# Patient Record
Sex: Female | Born: 1945
Health system: Southern US, Community
[De-identification: ages and names within clinical notes are randomized; demographics above are authoritative.]

## PROBLEM LIST (undated history)

## (undated) DIAGNOSIS — K649 Unspecified hemorrhoids: Secondary | ICD-10-CM

## (undated) DIAGNOSIS — E079 Disorder of thyroid, unspecified: Secondary | ICD-10-CM

## (undated) DIAGNOSIS — R195 Other fecal abnormalities: Secondary | ICD-10-CM

## (undated) DIAGNOSIS — E039 Hypothyroidism, unspecified: Secondary | ICD-10-CM

## (undated) DIAGNOSIS — N189 Chronic kidney disease, unspecified: Secondary | ICD-10-CM

## (undated) HISTORY — DX: Disorder of thyroid, unspecified: E07.9

## (undated) HISTORY — DX: Other fecal abnormalities: R19.5

## (undated) HISTORY — DX: Hypothyroidism, unspecified: E03.9

## (undated) HISTORY — DX: Chronic kidney disease, unspecified: N18.9

## (undated) HISTORY — PX: ABDOMINAL HYSTERECTOMY: SHX81

## (undated) HISTORY — DX: Unspecified hemorrhoids: K64.9

---

## 2000-10-05 ENCOUNTER — Encounter: Payer: Self-pay | Admitting: Specialist

## 2000-10-05 ENCOUNTER — Ambulatory Visit (HOSPITAL_COMMUNITY): Admission: RE | Admit: 2000-10-05 | Discharge: 2000-10-05 | Payer: Self-pay | Admitting: Specialist

## 2000-10-27 ENCOUNTER — Other Ambulatory Visit: Admission: RE | Admit: 2000-10-27 | Discharge: 2000-10-27 | Payer: Self-pay | Admitting: Specialist

## 2001-09-20 ENCOUNTER — Ambulatory Visit (HOSPITAL_COMMUNITY): Admission: RE | Admit: 2001-09-20 | Discharge: 2001-09-20 | Payer: Self-pay | Admitting: Specialist

## 2001-09-20 ENCOUNTER — Encounter: Payer: Self-pay | Admitting: Specialist

## 2002-10-31 ENCOUNTER — Ambulatory Visit (HOSPITAL_COMMUNITY): Admission: RE | Admit: 2002-10-31 | Discharge: 2002-10-31 | Payer: Self-pay | Admitting: Obstetrics and Gynecology

## 2002-10-31 ENCOUNTER — Encounter: Payer: Self-pay | Admitting: Obstetrics and Gynecology

## 2003-11-01 ENCOUNTER — Ambulatory Visit (HOSPITAL_COMMUNITY): Admission: RE | Admit: 2003-11-01 | Discharge: 2003-11-01 | Payer: Self-pay | Admitting: Obstetrics and Gynecology

## 2004-11-03 ENCOUNTER — Ambulatory Visit (HOSPITAL_COMMUNITY): Admission: RE | Admit: 2004-11-03 | Discharge: 2004-11-03 | Payer: Self-pay | Admitting: Obstetrics and Gynecology

## 2005-11-04 ENCOUNTER — Ambulatory Visit (HOSPITAL_COMMUNITY): Admission: RE | Admit: 2005-11-04 | Discharge: 2005-11-04 | Payer: Self-pay | Admitting: Obstetrics and Gynecology

## 2005-12-28 ENCOUNTER — Ambulatory Visit (HOSPITAL_COMMUNITY): Admission: RE | Admit: 2005-12-28 | Discharge: 2005-12-28 | Payer: Self-pay | Admitting: Internal Medicine

## 2005-12-28 ENCOUNTER — Ambulatory Visit: Payer: Self-pay | Admitting: Internal Medicine

## 2006-10-21 ENCOUNTER — Ambulatory Visit (HOSPITAL_COMMUNITY): Admission: RE | Admit: 2006-10-21 | Discharge: 2006-10-21 | Payer: Self-pay | Admitting: Obstetrics and Gynecology

## 2006-11-15 ENCOUNTER — Other Ambulatory Visit: Admission: RE | Admit: 2006-11-15 | Discharge: 2006-11-15 | Payer: Self-pay | Admitting: Obstetrics and Gynecology

## 2007-08-31 ENCOUNTER — Ambulatory Visit (HOSPITAL_COMMUNITY): Admission: RE | Admit: 2007-08-31 | Discharge: 2007-08-31 | Payer: Self-pay | Admitting: Obstetrics and Gynecology

## 2007-09-12 ENCOUNTER — Ambulatory Visit (HOSPITAL_COMMUNITY): Admission: RE | Admit: 2007-09-12 | Discharge: 2007-09-12 | Payer: Self-pay | Admitting: Obstetrics and Gynecology

## 2007-11-07 ENCOUNTER — Other Ambulatory Visit: Admission: RE | Admit: 2007-11-07 | Discharge: 2007-11-07 | Payer: Self-pay | Admitting: Obstetrics and Gynecology

## 2008-09-13 ENCOUNTER — Ambulatory Visit (HOSPITAL_COMMUNITY): Admission: RE | Admit: 2008-09-13 | Discharge: 2008-09-13 | Payer: Self-pay | Admitting: Obstetrics and Gynecology

## 2008-11-07 ENCOUNTER — Other Ambulatory Visit: Admission: RE | Admit: 2008-11-07 | Discharge: 2008-11-07 | Payer: Self-pay | Admitting: Obstetrics and Gynecology

## 2009-05-27 ENCOUNTER — Ambulatory Visit (HOSPITAL_COMMUNITY)
Admission: RE | Admit: 2009-05-27 | Discharge: 2009-05-27 | Payer: Self-pay | Admitting: Physical Medicine and Rehabilitation

## 2009-09-27 ENCOUNTER — Ambulatory Visit (HOSPITAL_COMMUNITY): Admission: RE | Admit: 2009-09-27 | Discharge: 2009-09-27 | Payer: Self-pay | Admitting: Obstetrics and Gynecology

## 2010-07-25 NOTE — Op Note (Signed)
NAME:  Brooke Leach, Brooke Leach              ACCOUNT NO.:  [id number]   MEDICAL RECORD NO.:  [id number]          PATIENT TYPE:  AMB   LOCATION:  DAY                           FACILITY:  [facility]   PHYSICIAN:  R. Roetta Sessions, M.D. DATE OF BIRTH:  [birth year]   DATE OF PROCEDURE:  12/28/2005  DATE OF DISCHARGE:                                 OPERATIVE REPORT   INDICATIONS FOR PROCEDURE:  The patient is a 65 year old lady sent over  courtesy of Dr. Christin Bach for colorectal cancer screening.  She is  followed primarily by Dr. Carylon Perches as well.  She has no lower GI tract  symptoms.  She has never had her colon imaged.  There is no family history  of colorectal neoplasia.  Colonoscopy is now being done as a screening  maneuver.  This approach has been discussed with the patient at length.  Potential risks, benefits, and alternatives  have been reviewed, and  questions answered.  She is agreeable.  Please see documentation in the  medical record.   PROCEDURE NOTE:  O2 saturation, blood pressure, pulse, and respirations were  monitored throughout the entire procedure.   CONSCIOUS SEDATION:  Versed 4 mg IV, Demerol 75 mg IV in divided doses.   INSTRUMENT:  Olympus video chip system.   FINDINGS:  Digital rectal exam revealed no abnormalities.  Endoscopic  findings showed the prep was good.  Rectum:  Examination rectal mucosa and  retroflexed view of the anal verge revealed no abnormalities.  Colon:  Colonic mucosa was surveyed from the rectosigmoid junction through the left,  transverse, right colon, the appendiceal orifice, ileocecal valve, and  cecum.  These structures well seen photographed for the record.  From this  level, the scope slowly and cautiously withdrawn.  All previously mentioned  mucosal surfaces were again seen.  Colonic mucosa appeared normal.  The  patient tolerated the procedure well and was reactivated.   IMPRESSION:  1. Normal rectum.  2. Normal colon.   RECOMMENDATIONS:  Repeat screening colonoscopy in 10 years.Jonathon Bellows, M.D.  Electronically Signed     RMR/MEDQ  D:  12/28/2005  T:  12/28/2005  Job:  [id number]   cc:   Tilda Burrow, M.D.  Fax: [phone]   Kingsley Callander. Ouida Sills, MD  Fax: [phone]

## 2010-08-26 ENCOUNTER — Other Ambulatory Visit: Payer: Self-pay | Admitting: Obstetrics and Gynecology

## 2010-08-26 DIAGNOSIS — Z139 Encounter for screening, unspecified: Secondary | ICD-10-CM

## 2010-09-30 ENCOUNTER — Ambulatory Visit (HOSPITAL_COMMUNITY)
Admission: RE | Admit: 2010-09-30 | Discharge: 2010-09-30 | Disposition: A | Discharge status: Home / Self Care | Payer: Medicare Other | Source: Ambulatory Visit | Attending: Obstetrics and Gynecology | Admitting: Obstetrics and Gynecology

## 2010-09-30 DIAGNOSIS — Z139 Encounter for screening, unspecified: Secondary | ICD-10-CM

## 2010-09-30 DIAGNOSIS — Z1231 Encounter for screening mammogram for malignant neoplasm of breast: Secondary | ICD-10-CM | POA: Insufficient documentation

## 2010-10-06 ENCOUNTER — Other Ambulatory Visit: Payer: Self-pay | Admitting: Obstetrics and Gynecology

## 2010-10-06 DIAGNOSIS — R928 Other abnormal and inconclusive findings on diagnostic imaging of breast: Secondary | ICD-10-CM

## 2010-10-15 ENCOUNTER — Ambulatory Visit (HOSPITAL_COMMUNITY)
Admission: RE | Admit: 2010-10-15 | Discharge: 2010-10-15 | Disposition: A | Discharge status: Home / Self Care | Payer: Medicare Other | Source: Ambulatory Visit | Attending: Obstetrics and Gynecology | Admitting: Obstetrics and Gynecology

## 2010-10-15 DIAGNOSIS — R928 Other abnormal and inconclusive findings on diagnostic imaging of breast: Secondary | ICD-10-CM | POA: Insufficient documentation

## 2010-10-22 ENCOUNTER — Inpatient Hospital Stay (HOSPITAL_COMMUNITY): Admission: RE | Admit: 2010-10-22 | Payer: Medicare Other | Source: Ambulatory Visit

## 2011-09-25 ENCOUNTER — Other Ambulatory Visit: Payer: Self-pay | Admitting: Obstetrics and Gynecology

## 2011-09-25 DIAGNOSIS — Z139 Encounter for screening, unspecified: Secondary | ICD-10-CM

## 2011-10-02 ENCOUNTER — Ambulatory Visit (HOSPITAL_COMMUNITY)
Admission: RE | Admit: 2011-10-02 | Discharge: 2011-10-02 | Disposition: A | Discharge status: Home / Self Care | Payer: Medicare Other | Source: Ambulatory Visit | Attending: Obstetrics and Gynecology | Admitting: Obstetrics and Gynecology

## 2011-10-02 DIAGNOSIS — Z1231 Encounter for screening mammogram for malignant neoplasm of breast: Secondary | ICD-10-CM | POA: Insufficient documentation

## 2011-10-02 DIAGNOSIS — Z139 Encounter for screening, unspecified: Secondary | ICD-10-CM

## 2012-09-07 ENCOUNTER — Other Ambulatory Visit: Payer: Self-pay | Admitting: Obstetrics and Gynecology

## 2012-09-07 DIAGNOSIS — Z139 Encounter for screening, unspecified: Secondary | ICD-10-CM

## 2012-10-04 ENCOUNTER — Ambulatory Visit (HOSPITAL_COMMUNITY)
Admission: RE | Admit: 2012-10-04 | Discharge: 2012-10-04 | Disposition: A | Discharge status: Home / Self Care | Payer: Medicare Other | Source: Ambulatory Visit | Attending: Obstetrics and Gynecology | Admitting: Obstetrics and Gynecology

## 2012-10-04 DIAGNOSIS — Z139 Encounter for screening, unspecified: Secondary | ICD-10-CM

## 2012-10-04 DIAGNOSIS — Z1231 Encounter for screening mammogram for malignant neoplasm of breast: Secondary | ICD-10-CM | POA: Insufficient documentation

## 2012-12-06 ENCOUNTER — Encounter: Payer: Self-pay | Admitting: Adult Health

## 2012-12-06 ENCOUNTER — Other Ambulatory Visit: Payer: Medicare Other

## 2012-12-06 ENCOUNTER — Ambulatory Visit (INDEPENDENT_AMBULATORY_CARE_PROVIDER_SITE_OTHER): Payer: Medicare Other | Admitting: Adult Health

## 2012-12-06 VITALS — BP 150/78 | HR 72 | Ht 62.5 in | Wt 172.0 lb

## 2012-12-06 DIAGNOSIS — Z1212 Encounter for screening for malignant neoplasm of rectum: Secondary | ICD-10-CM

## 2012-12-06 DIAGNOSIS — E039 Hypothyroidism, unspecified: Secondary | ICD-10-CM

## 2012-12-06 DIAGNOSIS — E785 Hyperlipidemia, unspecified: Secondary | ICD-10-CM

## 2012-12-06 DIAGNOSIS — Z01419 Encounter for gynecological examination (general) (routine) without abnormal findings: Secondary | ICD-10-CM

## 2012-12-06 DIAGNOSIS — Z79899 Other long term (current) drug therapy: Secondary | ICD-10-CM

## 2012-12-06 LAB — CBC
Platelets: 256 10*3/uL (ref 150–400)
RDW: 13.3 % (ref 11.5–15.5)
WBC: 6.7 10*3/uL (ref 4.0–10.5)

## 2012-12-06 LAB — HEMOCCULT GUIAC POC 1CARD (OFFICE): Fecal Occult Blood, POC: NEGATIVE

## 2012-12-06 LAB — COMPREHENSIVE METABOLIC PANEL
ALT: 24 U/L (ref 0–35)
AST: 21 U/L (ref 0–37)
Albumin: 4.1 g/dL (ref 3.5–5.2)
Calcium: 9.7 mg/dL (ref 8.4–10.5)
Chloride: 102 mEq/L (ref 96–112)
Potassium: 5 mEq/L (ref 3.5–5.3)

## 2012-12-06 LAB — LIPID PANEL: Total CHOL/HDL Ratio: 3.5 Ratio

## 2012-12-06 NOTE — Patient Instructions (Addendum)
Physical in 1 year Mammogram yearly Flu shot soon Colonoscopy per Dr Jena Gauss

## 2012-12-06 NOTE — Progress Notes (Signed)
Patient ID: Brooke Leach, female   DOB: [birth year], 67 y.o.   MRN: [id number] History of Present Illness: Brooke Bible is a 67 year old white female married in for physical.No complaints. Had labs drawn fasting this am.  Current Medications, Allergies, Past Medical History, Past Surgical History, Family History and Social History were reviewed in [name] record.     Review of Systems: Patient denies any headaches, blurred vision, shortness of breath, chest pain, abdominal pain, problems with bowel movements, urination, or intercourse.Not currently having sex, no joint swelling or mood swings.     Physical Exam:BP 150/78  Pulse 72  Ht 5' 2.5" (1.588 m)  Wt 172 lb (78.019 kg)  BMI 30.94 kg/m2 General:  Well developed, well nourished, no acute distress Skin:  Warm and dry,tan Neck:  Midline trachea, normal thyroid, no carotid bruits heard Lungs; Clear to auscultation bilaterally Breast:  No dominant palpable mass, retraction, or nipple discharge Cardiovascular: Regular rate and rhythm Abdomen:  Soft, non tender, no hepatosplenomegaly Pelvic:  External genitalia is normal in appearance.  The vagina is normal in appearance for age.               The cervix and uterus are absent.  No  adnexal masses or tenderness noted. Rectal: Good sphincter tone, no polyps, or hemorrhoids felt.  Hemoccult negative. Extremities:  No swelling noted Psych:  No mood changes, alert an cooperative seems happy   Impression: Yearly gyn exam no pap    Plan: Physical in 1 year Mammogram yearly  Colonoscopy as per GI Get flu shot Can follow up labs in 24-48 hours by phone

## 2012-12-07 ENCOUNTER — Telehealth: Payer: Self-pay | Admitting: Adult Health

## 2012-12-07 LAB — THYROID PANEL WITH TSH
T3 Uptake: 32.4 % (ref 22.5–37.0)
T4, Total: 8.7 ug/dL (ref 5.0–12.5)

## 2012-12-07 NOTE — Telephone Encounter (Signed)
Pt aware of labs  

## 2013-04-16 ENCOUNTER — Other Ambulatory Visit: Payer: Self-pay | Admitting: Adult Health

## 2013-04-21 ENCOUNTER — Other Ambulatory Visit: Payer: Self-pay | Admitting: Adult Health

## 2013-04-21 ENCOUNTER — Telehealth: Payer: Self-pay | Admitting: Adult Health

## 2013-04-21 NOTE — Telephone Encounter (Signed)
Left message to call me monday

## 2013-08-25 ENCOUNTER — Other Ambulatory Visit: Payer: Self-pay | Admitting: Adult Health

## 2013-08-25 DIAGNOSIS — Z1231 Encounter for screening mammogram for malignant neoplasm of breast: Secondary | ICD-10-CM

## 2013-10-05 ENCOUNTER — Ambulatory Visit (HOSPITAL_COMMUNITY)
Admission: RE | Admit: 2013-10-05 | Discharge: 2013-10-05 | Disposition: A | Discharge status: Home / Self Care | Payer: Medicare Other | Source: Ambulatory Visit | Attending: Adult Health | Admitting: Adult Health

## 2013-10-05 DIAGNOSIS — Z1231 Encounter for screening mammogram for malignant neoplasm of breast: Secondary | ICD-10-CM | POA: Insufficient documentation

## 2013-12-11 ENCOUNTER — Ambulatory Visit (INDEPENDENT_AMBULATORY_CARE_PROVIDER_SITE_OTHER): Payer: Medicare Other | Admitting: Adult Health

## 2013-12-11 ENCOUNTER — Encounter: Payer: Self-pay | Admitting: Adult Health

## 2013-12-11 VITALS — BP 148/70 | HR 74 | Ht 62.0 in | Wt 155.0 lb

## 2013-12-11 DIAGNOSIS — E039 Hypothyroidism, unspecified: Secondary | ICD-10-CM

## 2013-12-11 DIAGNOSIS — E038 Other specified hypothyroidism: Secondary | ICD-10-CM

## 2013-12-11 DIAGNOSIS — E782 Mixed hyperlipidemia: Secondary | ICD-10-CM | POA: Diagnosis not present

## 2013-12-11 DIAGNOSIS — Z01419 Encounter for gynecological examination (general) (routine) without abnormal findings: Secondary | ICD-10-CM | POA: Diagnosis not present

## 2013-12-11 DIAGNOSIS — Z1212 Encounter for screening for malignant neoplasm of rectum: Secondary | ICD-10-CM

## 2013-12-11 HISTORY — DX: Hypothyroidism, unspecified: E03.9

## 2013-12-11 LAB — HEMOCCULT GUIAC POC 1CARD (OFFICE): Fecal Occult Blood, POC: NEGATIVE

## 2013-12-11 NOTE — Progress Notes (Signed)
Patient ID: Dawayne Cirriatricia M Mcqueen, female   DOB: Apr 14, 1945, 68 y.o.   MRN: [id number] History of Present Illness:  [name] is a 68 year old white female, married in for a gyn physical.  Current Medications, Allergies, Past Medical History, Past Surgical History, Family History and Social History were reviewed in [facility] electronic medical record.     Review of Systems: Patient denies any headaches, blurred vision, shortness of breath, chest pain, abdominal pain, problems with bowel movements, urination, or intercourse. Not having sex often, no joint pain or mood swings.    Physical Exam:BP 148/70  Pulse 74  Ht 5\' 2"  (1.575 m)  Wt 155 lb (70.308 kg)  BMI 28.34 kg/m2 General:  Well developed, well nourished, no acute distress Skin:  Warm and dry Neck:  Midline trachea, normal thyroid Lungs; Clear to auscultation bilaterally Breast:  No dominant palpable mass, retraction, or nipple discharge Cardiovascular: Regular rate and rhythm Abdomen:  Soft, non tender, no hepatosplenomegaly Pelvic:  External genitalia is normal in appearance, no lesions.  The vagina has decrease color, moisture and rugae.   The cervix and uterus are absent.  No   adnexal masses or tenderness noted. Rectal: Good sphincter tone, no polyps, or hemorrhoids felt.  Hemoccult negative. Extremities:  No swelling or varicosities noted Psych:  No mood changes, alert and cooperative,seems happy   Impression: Well woman gyn exam no pap Hypothyroidism History of elevated triglycerides   Plan: Check CBC,CMP,TSH and lipids Physical in 1 year Mammogram yearly Get flu shot Colonoscopy per Dr Jena Gaussourk Continue synthroid

## 2013-12-11 NOTE — Patient Instructions (Signed)
Physical in 1 year Mammogram yearly Colonoscopy per Dr Jena Gaussourk

## 2013-12-12 ENCOUNTER — Telehealth: Payer: Self-pay | Admitting: Adult Health

## 2013-12-12 LAB — CBC
HCT: 42.8 % (ref 36.0–46.0)
HEMOGLOBIN: 14.3 g/dL (ref 12.0–15.0)
MCH: 32.3 pg (ref 26.0–34.0)
MCHC: 33.4 g/dL (ref 30.0–36.0)
MCV: 96.6 fL (ref 78.0–100.0)
PLATELETS: 239 10*3/uL (ref 150–400)
RBC: 4.43 MIL/uL (ref 3.87–5.11)
RDW: 13 % (ref 11.5–15.5)
WBC: 6.8 10*3/uL (ref 4.0–10.5)

## 2013-12-12 LAB — COMPREHENSIVE METABOLIC PANEL
ALBUMIN: 4.2 g/dL (ref 3.5–5.2)
ALT: 19 U/L (ref 0–35)
AST: 20 U/L (ref 0–37)
Alkaline Phosphatase: 55 U/L (ref 39–117)
BUN: 19 mg/dL (ref 6–23)
CALCIUM: 9.8 mg/dL (ref 8.4–10.5)
CHLORIDE: 104 meq/L (ref 96–112)
CO2: 30 meq/L (ref 19–32)
Creat: 0.9 mg/dL (ref 0.50–1.10)
GLUCOSE: 89 mg/dL (ref 70–99)
Potassium: 4.3 mEq/L (ref 3.5–5.3)
SODIUM: 143 meq/L (ref 135–145)
TOTAL PROTEIN: 7.1 g/dL (ref 6.0–8.3)
Total Bilirubin: 0.9 mg/dL (ref 0.2–1.2)

## 2013-12-12 LAB — TSH: TSH: 3.196 u[IU]/mL (ref 0.350–4.500)

## 2013-12-12 LAB — LIPID PANEL
CHOLESTEROL: 179 mg/dL (ref 0–200)
HDL: 63 mg/dL (ref >39)
LDL Cholesterol: 85 mg/dL (ref 0–99)
Total CHOL/HDL Ratio: 2.8 Ratio
Triglycerides: 157 mg/dL — ABNORMAL HIGH (ref <150)
VLDL: 31 mg/dL (ref 0–40)

## 2013-12-12 NOTE — Telephone Encounter (Signed)
Pt aware of labs  

## 2014-01-08 ENCOUNTER — Encounter: Payer: Self-pay | Admitting: Adult Health

## 2014-02-27 DIAGNOSIS — Z23 Encounter for immunization: Secondary | ICD-10-CM | POA: Diagnosis not present

## 2014-04-12 ENCOUNTER — Other Ambulatory Visit: Payer: Self-pay | Admitting: Adult Health

## 2014-09-03 ENCOUNTER — Other Ambulatory Visit: Payer: Self-pay

## 2014-09-25 ENCOUNTER — Other Ambulatory Visit: Payer: Self-pay | Admitting: Adult Health

## 2014-09-25 DIAGNOSIS — Z1231 Encounter for screening mammogram for malignant neoplasm of breast: Secondary | ICD-10-CM

## 2014-10-10 ENCOUNTER — Ambulatory Visit (HOSPITAL_COMMUNITY)
Admission: RE | Admit: 2014-10-10 | Discharge: 2014-10-10 | Disposition: A | Discharge status: Home / Self Care | Payer: Medicare Other | Source: Ambulatory Visit | Attending: Adult Health | Admitting: Adult Health

## 2014-10-10 DIAGNOSIS — Z1231 Encounter for screening mammogram for malignant neoplasm of breast: Secondary | ICD-10-CM | POA: Diagnosis not present

## 2014-11-27 DIAGNOSIS — Z23 Encounter for immunization: Secondary | ICD-10-CM | POA: Diagnosis not present

## 2014-12-13 ENCOUNTER — Other Ambulatory Visit: Payer: Medicare Other | Admitting: Adult Health

## 2014-12-17 ENCOUNTER — Ambulatory Visit (INDEPENDENT_AMBULATORY_CARE_PROVIDER_SITE_OTHER): Payer: Medicare Other | Admitting: Adult Health

## 2014-12-17 ENCOUNTER — Encounter: Payer: Self-pay | Admitting: Adult Health

## 2014-12-17 VITALS — BP 140/80 | HR 92 | Ht 62.75 in | Wt 166.5 lb

## 2014-12-17 DIAGNOSIS — R195 Other fecal abnormalities: Secondary | ICD-10-CM

## 2014-12-17 DIAGNOSIS — E782 Mixed hyperlipidemia: Secondary | ICD-10-CM | POA: Diagnosis not present

## 2014-12-17 DIAGNOSIS — K649 Unspecified hemorrhoids: Secondary | ICD-10-CM | POA: Diagnosis not present

## 2014-12-17 DIAGNOSIS — Z01419 Encounter for gynecological examination (general) (routine) without abnormal findings: Secondary | ICD-10-CM

## 2014-12-17 DIAGNOSIS — E038 Other specified hypothyroidism: Secondary | ICD-10-CM

## 2014-12-17 HISTORY — DX: Other fecal abnormalities: R19.5

## 2014-12-17 HISTORY — DX: Unspecified hemorrhoids: K64.9

## 2014-12-17 LAB — HEMOCCULT GUIAC POC 1CARD (OFFICE): Fecal Occult Blood, POC: POSITIVE — AB

## 2014-12-17 NOTE — Patient Instructions (Signed)
Physical in 1 year Mammogram yearly Colonoscopy per GI Do 3 hemoccult cards Labs in am fasting

## 2014-12-17 NOTE — Progress Notes (Signed)
Patient ID: Brooke Leach, female   DOB: [birth year], 69 y.o.   MRN: [id number] History of Present Illness: Brooke Leach is a 69 year old white female, married in for well woman gyn exam.She is sp hysterectomy. PCP is Dr Ouida Sills, but has not seen except for flu shot in September.  Current Medications, Allergies, Past Medical History, Past Surgical History, Family History and Social History were reviewed in [name] record.     Review of Systems: Patient denies any headaches, hearing loss, fatigue, blurred vision, shortness of breath, chest pain, abdominal pain, problems with bowel movements, urination, or intercourse(not having sex). No joint pain or mood swings.    Physical Exam:BP 140/80 mmHg  Pulse 92  Ht 5' 2.75" (1.594 m)  Wt 166 lb 8 oz (75.524 kg)  BMI 29.72 kg/m2 General:  Well developed, well nourished, no acute distress Skin:  Warm and dry Neck:  Midline trachea, normal thyroid, good ROM, no lymphadenopathy, no carotid bruits heard Lungs; Clear to auscultation bilaterally Breast:  No dominant palpable mass, retraction, or nipple discharge Cardiovascular: Regular rate and rhythm Abdomen:  Soft, non tender, no hepatosplenomegaly Pelvic:  External genitalia is normal in appearance, no lesions.  The vagina has decreased color, moisture and rugae. Urethra has no lesions or masses. The cervix and uterus are absent.  No adnexal masses or tenderness noted.Bladder is non tender, no masses felt. Rectal: Good sphincter tone, no polyps, internal hemorrhoids felt.  Hemoccult positive, had hard BM this am,will sent 3 cards home Extremities/musculoskeletal:  No swelling or varicosities noted, no clubbing or cyanosis Psych:  No mood changes, alert and cooperative,seems happy   Impression: Well woman gyn exam no pap Hypothyroid Elevated triglycerides  +hemoccult Hemorrhoids    Plan: 3 hemoccult cards home with pt,pt is aware if any + will refer to GI,Dr Rourk's  office Check CBC,CMP,TSH and lipids, fasting in am.orders given Mammogram yearly Colonoscopy per GI Physical in 1 year Continue synthroid, has refills

## 2014-12-18 DIAGNOSIS — E782 Mixed hyperlipidemia: Secondary | ICD-10-CM | POA: Diagnosis not present

## 2014-12-18 DIAGNOSIS — E038 Other specified hypothyroidism: Secondary | ICD-10-CM | POA: Diagnosis not present

## 2014-12-18 DIAGNOSIS — Z01419 Encounter for gynecological examination (general) (routine) without abnormal findings: Secondary | ICD-10-CM | POA: Diagnosis not present

## 2014-12-19 ENCOUNTER — Telehealth: Payer: Self-pay | Admitting: Adult Health

## 2014-12-19 LAB — COMPREHENSIVE METABOLIC PANEL
A/G RATIO: 1.4 (ref 1.1–2.5)
ALBUMIN: 4 g/dL (ref 3.5–4.8)
ALT: 41 IU/L — ABNORMAL HIGH (ref 0–32)
AST: 31 IU/L (ref 0–40)
Alkaline Phosphatase: 73 IU/L (ref 39–117)
BUN / CREAT RATIO: 13 (ref 11–26)
BUN: 13 mg/dL (ref 8–27)
Bilirubin Total: 0.7 mg/dL (ref 0.0–1.2)
CALCIUM: 9.6 mg/dL (ref 8.7–10.3)
CO2: 26 mmol/L (ref 18–29)
CREATININE: 1.03 mg/dL — AB (ref 0.57–1.00)
Chloride: 102 mmol/L (ref 97–108)
GFR calc Af Amer: 64 mL/min/{1.73_m2} (ref >59)
GFR, EST NON AFRICAN AMERICAN: 55 mL/min/{1.73_m2} — AB (ref >59)
GLOBULIN, TOTAL: 2.8 g/dL (ref 1.5–4.5)
Glucose: 104 mg/dL — ABNORMAL HIGH (ref 65–99)
POTASSIUM: 4.6 mmol/L (ref 3.5–5.2)
SODIUM: 142 mmol/L (ref 134–144)
Total Protein: 6.8 g/dL (ref 6.0–8.5)

## 2014-12-19 LAB — LIPID PANEL
CHOL/HDL RATIO: 4.4 ratio (ref 0.0–4.4)
Cholesterol, Total: 181 mg/dL (ref 100–199)
HDL: 41 mg/dL (ref >39)
LDL CALC: 95 mg/dL (ref 0–99)
Triglycerides: 224 mg/dL — ABNORMAL HIGH (ref 0–149)
VLDL Cholesterol Cal: 45 mg/dL — ABNORMAL HIGH (ref 5–40)

## 2014-12-19 LAB — CBC
HEMATOCRIT: 42.8 % (ref 34.0–46.6)
HEMOGLOBIN: 14.7 g/dL (ref 11.1–15.9)
MCH: 32.5 pg (ref 26.6–33.0)
MCHC: 34.3 g/dL (ref 31.5–35.7)
MCV: 95 fL (ref 79–97)
Platelets: 192 10*3/uL (ref 150–379)
RBC: 4.53 x10E6/uL (ref 3.77–5.28)
RDW: 13.4 % (ref 12.3–15.4)
WBC: 4.9 10*3/uL (ref 3.4–10.8)

## 2014-12-19 LAB — TSH: TSH: 6.3 u[IU]/mL — AB (ref 0.450–4.500)

## 2014-12-19 MED ORDER — LEVOTHYROXINE SODIUM 25 MCG PO TABS: ORAL_TABLET | ORAL | Status: DC

## 2014-12-19 NOTE — Telephone Encounter (Signed)
Pt aware of labs and need to take 50 mcg of synthroid and recheck labs in 3 months

## 2014-12-20 ENCOUNTER — Other Ambulatory Visit (INDEPENDENT_AMBULATORY_CARE_PROVIDER_SITE_OTHER): Payer: Medicare Other

## 2014-12-20 DIAGNOSIS — Z1212 Encounter for screening for malignant neoplasm of rectum: Secondary | ICD-10-CM | POA: Diagnosis not present

## 2014-12-20 LAB — HEMOCCULT GUIAC POC 1CARD (OFFICE)
Card #2 Fecal Occult Blod, POC: NEGATIVE
Card #3 Fecal Occult Blood, POC: NEGATIVE
FECAL OCCULT BLD: NEGATIVE

## 2014-12-20 NOTE — Progress Notes (Signed)
Pt brought hemocult cards by the office and JAG checked them. All negative x 3. Pt aware. JSY

## 2015-06-23 ENCOUNTER — Other Ambulatory Visit: Payer: Self-pay | Admitting: Adult Health

## 2015-07-17 ENCOUNTER — Telehealth: Payer: Self-pay | Admitting: Adult Health

## 2015-07-17 DIAGNOSIS — E039 Hypothyroidism, unspecified: Secondary | ICD-10-CM

## 2015-07-17 NOTE — Telephone Encounter (Signed)
Spoke with pt letting her know we can order TSH and she doesn't need to be seen. Pt voiced understanding and order was put in. Pt plans on having labs tomorrow. JSY

## 2015-07-17 NOTE — Addendum Note (Signed)
Addended by: Colen DarlingYOUNG, Dinisha Cai S on: 07/17/2015 02:05 PM   Modules accepted: Orders

## 2015-07-17 NOTE — Telephone Encounter (Signed)
We can order TSH

## 2015-07-18 DIAGNOSIS — E039 Hypothyroidism, unspecified: Secondary | ICD-10-CM | POA: Diagnosis not present

## 2015-07-19 ENCOUNTER — Telehealth: Payer: Self-pay | Admitting: Adult Health

## 2015-07-19 LAB — TSH: TSH: 1.11 u[IU]/mL (ref 0.450–4.500)

## 2015-07-19 NOTE — Telephone Encounter (Signed)
Pt aware TSH great, continue current dose

## 2015-09-27 ENCOUNTER — Other Ambulatory Visit: Payer: Self-pay | Admitting: Adult Health

## 2015-09-27 DIAGNOSIS — Z1231 Encounter for screening mammogram for malignant neoplasm of breast: Secondary | ICD-10-CM

## 2015-10-16 ENCOUNTER — Ambulatory Visit (HOSPITAL_COMMUNITY)
Admission: RE | Admit: 2015-10-16 | Discharge: 2015-10-16 | Disposition: A | Discharge status: Home / Self Care | Payer: Medicare Other | Source: Ambulatory Visit | Attending: Adult Health | Admitting: Adult Health

## 2015-10-16 DIAGNOSIS — Z1231 Encounter for screening mammogram for malignant neoplasm of breast: Secondary | ICD-10-CM | POA: Insufficient documentation

## 2015-11-07 ENCOUNTER — Telehealth: Payer: Self-pay | Admitting: Internal Medicine

## 2015-11-07 NOTE — Telephone Encounter (Signed)
Pt is due a 10 yr colonoscopy

## 2015-11-07 NOTE — Telephone Encounter (Signed)
Letter mailed

## 2015-12-12 DIAGNOSIS — Z23 Encounter for immunization: Secondary | ICD-10-CM | POA: Diagnosis not present

## 2015-12-18 ENCOUNTER — Other Ambulatory Visit: Payer: Self-pay | Admitting: Adult Health

## 2015-12-23 ENCOUNTER — Ambulatory Visit (INDEPENDENT_AMBULATORY_CARE_PROVIDER_SITE_OTHER): Payer: Medicare Other | Admitting: Adult Health

## 2015-12-23 ENCOUNTER — Encounter: Payer: Self-pay | Admitting: Adult Health

## 2015-12-23 VITALS — BP 150/80 | HR 72 | Ht 62.5 in | Wt 167.0 lb

## 2015-12-23 DIAGNOSIS — Z01419 Encounter for gynecological examination (general) (routine) without abnormal findings: Secondary | ICD-10-CM

## 2015-12-23 DIAGNOSIS — E038 Other specified hypothyroidism: Secondary | ICD-10-CM

## 2015-12-23 DIAGNOSIS — Z1212 Encounter for screening for malignant neoplasm of rectum: Secondary | ICD-10-CM | POA: Diagnosis not present

## 2015-12-23 DIAGNOSIS — K649 Unspecified hemorrhoids: Secondary | ICD-10-CM

## 2015-12-23 DIAGNOSIS — E782 Mixed hyperlipidemia: Secondary | ICD-10-CM

## 2015-12-23 LAB — HEMOCCULT GUIAC POC 1CARD (OFFICE): Fecal Occult Blood, POC: NEGATIVE

## 2015-12-23 NOTE — Progress Notes (Signed)
Patient ID: Brooke Leach, female   DOB: 1946/01/28, 70 y.o.   MRN: [id number] History of Present Illness: Brooke Leach is a 70 year old white female,married in for well woman gyn exam,she is sp hysterectomy. PCP is Dr Ouida SillsFagan, she got flu shot at his office in September.    Current Medications, Allergies, Past Medical History, Past Surgical History, Family History and Social History were reviewed in [facility] electronic medical record.     Review of Systems: Patient denies any headaches, hearing loss, fatigue, blurred vision, shortness of breath, chest pain, abdominal pain, problems with bowel movements, urination, or intercourse(not having sex). No joint pain or mood swings.    Physical Exam:BP (!) 150/80 (BP Location: Left Arm, Patient Position: Sitting, Cuff Size: Normal)   Pulse 72   Ht 5' 2.5" (1.588 m)   Wt 167 lb (75.8 kg)   BMI 30.06 kg/m  General:  Well developed, well nourished, no acute distress Skin:  Warm and dry Neck:  Midline trachea, normal thyroid, good ROM, no lymphadenopathy,no carotid bruits heard Lungs; Clear to auscultation bilaterally Breast:  No dominant palpable mass, retraction, or nipple discharge,has Ak's on chest,trunk and breasts Cardiovascular: Regular rate and rhythm Abdomen:  Soft, non tender, no hepatosplenomegaly Pelvic:  External genitalia is normal in appearance, no lesions.  The vagina is normal in appearance. Urethra has no lesions or masses. The cervix and uterus are absent.  No adnexal masses or tenderness noted.Bladder is non tender, no masses felt. Rectal: Good sphincter tone, no polyps, internal and external hemorrhoids felt.  Hemoccult negative. Extremities/musculoskeletal:  No swelling or varicosities noted, no clubbing or cyanosis Psych:  No mood changes, alert and cooperative,seems happy PHQ 2 score 0.  Impression: 1. Well woman exam with routine gynecological exam   2. Hemorrhoids, unspecified hemorrhoid type   3. Other specified  hypothyroidism   4. Elevated triglycerides with high cholesterol       Plan: Check CBC,CMP,TSH and lipids Continue synthroid 50 mcg, has refills  Physical in 1 year Mammogram yearly Colonoscopy per GI

## 2015-12-23 NOTE — Patient Instructions (Signed)
Physical in 1 year Mammogram yearly Colonoscopy per GI 

## 2015-12-24 DIAGNOSIS — E038 Other specified hypothyroidism: Secondary | ICD-10-CM | POA: Diagnosis not present

## 2015-12-24 DIAGNOSIS — E782 Mixed hyperlipidemia: Secondary | ICD-10-CM | POA: Diagnosis not present

## 2015-12-24 DIAGNOSIS — Z01419 Encounter for gynecological examination (general) (routine) without abnormal findings: Secondary | ICD-10-CM | POA: Diagnosis not present

## 2015-12-25 LAB — CBC
Hematocrit: 43 % (ref 34.0–46.6)
Hemoglobin: 14 g/dL (ref 11.1–15.9)
MCH: 31.7 pg (ref 26.6–33.0)
MCHC: 32.6 g/dL (ref 31.5–35.7)
MCV: 98 fL — AB (ref 79–97)
PLATELETS: 222 10*3/uL (ref 150–379)
RBC: 4.41 x10E6/uL (ref 3.77–5.28)
RDW: 12.9 % (ref 12.3–15.4)
WBC: 5.7 10*3/uL (ref 3.4–10.8)

## 2015-12-25 LAB — LIPID PANEL
CHOL/HDL RATIO: 3.5 ratio (ref 0.0–4.4)
Cholesterol, Total: 180 mg/dL (ref 100–199)
HDL: 52 mg/dL (ref >39)
LDL Calculated: 99 mg/dL (ref 0–99)
TRIGLYCERIDES: 147 mg/dL (ref 0–149)
VLDL Cholesterol Cal: 29 mg/dL (ref 5–40)

## 2015-12-25 LAB — COMPREHENSIVE METABOLIC PANEL
A/G RATIO: 1.5 (ref 1.2–2.2)
ALK PHOS: 55 IU/L (ref 39–117)
ALT: 21 IU/L (ref 0–32)
AST: 20 IU/L (ref 0–40)
Albumin: 4.1 g/dL (ref 3.5–4.8)
BILIRUBIN TOTAL: 0.9 mg/dL (ref 0.0–1.2)
BUN/Creatinine Ratio: 21 (ref 12–28)
BUN: 19 mg/dL (ref 8–27)
CHLORIDE: 104 mmol/L (ref 96–106)
CO2: 29 mmol/L (ref 18–29)
Calcium: 9.4 mg/dL (ref 8.7–10.3)
Creatinine, Ser: 0.9 mg/dL (ref 0.57–1.00)
GFR calc non Af Amer: 65 mL/min/{1.73_m2} (ref >59)
GFR, EST AFRICAN AMERICAN: 74 mL/min/{1.73_m2} (ref >59)
Globulin, Total: 2.7 g/dL (ref 1.5–4.5)
Glucose: 99 mg/dL (ref 65–99)
POTASSIUM: 4.3 mmol/L (ref 3.5–5.2)
Sodium: 143 mmol/L (ref 134–144)
TOTAL PROTEIN: 6.8 g/dL (ref 6.0–8.5)

## 2015-12-25 LAB — TSH: TSH: 3.1 u[IU]/mL (ref 0.450–4.500)

## 2016-02-04 DIAGNOSIS — L03011 Cellulitis of right finger: Secondary | ICD-10-CM | POA: Diagnosis not present

## 2016-06-14 ENCOUNTER — Other Ambulatory Visit: Payer: Self-pay | Admitting: Adult Health

## 2016-12-06 ENCOUNTER — Other Ambulatory Visit: Payer: Self-pay | Admitting: Adult Health

## 2017-01-04 DIAGNOSIS — Z23 Encounter for immunization: Secondary | ICD-10-CM | POA: Diagnosis not present

## 2017-06-05 ENCOUNTER — Other Ambulatory Visit: Payer: Self-pay | Admitting: Adult Health

## 2017-11-29 ENCOUNTER — Other Ambulatory Visit: Payer: Self-pay | Admitting: Adult Health

## 2018-01-12 DIAGNOSIS — Z23 Encounter for immunization: Secondary | ICD-10-CM | POA: Diagnosis not present

## 2018-03-29 ENCOUNTER — Other Ambulatory Visit (HOSPITAL_COMMUNITY): Payer: Self-pay | Admitting: Adult Health

## 2018-03-29 DIAGNOSIS — Z1231 Encounter for screening mammogram for malignant neoplasm of breast: Secondary | ICD-10-CM

## 2018-04-13 ENCOUNTER — Encounter (HOSPITAL_COMMUNITY): Payer: Self-pay

## 2018-04-13 ENCOUNTER — Ambulatory Visit (HOSPITAL_COMMUNITY)
Admission: RE | Admit: 2018-04-13 | Discharge: 2018-04-13 | Disposition: A | Discharge status: Home / Self Care | Payer: Medicare Other | Source: Ambulatory Visit | Attending: Adult Health | Admitting: Adult Health

## 2018-04-13 DIAGNOSIS — Z1231 Encounter for screening mammogram for malignant neoplasm of breast: Secondary | ICD-10-CM | POA: Diagnosis not present

## 2018-04-14 ENCOUNTER — Other Ambulatory Visit (HOSPITAL_COMMUNITY): Payer: Self-pay | Admitting: Adult Health

## 2018-04-14 DIAGNOSIS — R928 Other abnormal and inconclusive findings on diagnostic imaging of breast: Secondary | ICD-10-CM

## 2018-04-28 ENCOUNTER — Ambulatory Visit (HOSPITAL_COMMUNITY)
Admission: RE | Admit: 2018-04-28 | Discharge: 2018-04-28 | Disposition: A | Discharge status: Home / Self Care | Payer: Medicare Other | Source: Ambulatory Visit | Attending: Adult Health | Admitting: Adult Health

## 2018-04-28 ENCOUNTER — Ambulatory Visit (HOSPITAL_COMMUNITY): Payer: Medicare Other

## 2018-04-28 ENCOUNTER — Other Ambulatory Visit (HOSPITAL_COMMUNITY): Payer: Medicare Other

## 2018-04-28 DIAGNOSIS — R928 Other abnormal and inconclusive findings on diagnostic imaging of breast: Secondary | ICD-10-CM | POA: Insufficient documentation

## 2018-05-03 ENCOUNTER — Other Ambulatory Visit (HOSPITAL_COMMUNITY): Payer: Medicare Other

## 2018-05-03 ENCOUNTER — Encounter (HOSPITAL_COMMUNITY): Payer: Medicare Other

## 2018-05-05 ENCOUNTER — Encounter: Payer: Self-pay | Admitting: Adult Health

## 2018-05-05 ENCOUNTER — Ambulatory Visit (INDEPENDENT_AMBULATORY_CARE_PROVIDER_SITE_OTHER): Payer: Medicare Other | Admitting: Adult Health

## 2018-05-05 VITALS — BP 131/80 | HR 85 | Ht 63.5 in | Wt 172.0 lb

## 2018-05-05 DIAGNOSIS — Z1212 Encounter for screening for malignant neoplasm of rectum: Secondary | ICD-10-CM | POA: Diagnosis not present

## 2018-05-05 DIAGNOSIS — Z1211 Encounter for screening for malignant neoplasm of colon: Secondary | ICD-10-CM

## 2018-05-05 DIAGNOSIS — Z01419 Encounter for gynecological examination (general) (routine) without abnormal findings: Secondary | ICD-10-CM

## 2018-05-05 DIAGNOSIS — E039 Hypothyroidism, unspecified: Secondary | ICD-10-CM

## 2018-05-05 DIAGNOSIS — Z13220 Encounter for screening for lipoid disorders: Secondary | ICD-10-CM

## 2018-05-05 LAB — HEMOCCULT GUIAC POC 1CARD (OFFICE)

## 2018-05-05 NOTE — Addendum Note (Signed)
Addended by: Cyril Mourning A on: 05/05/2018 09:54 AM   Modules accepted: Orders

## 2018-05-05 NOTE — Progress Notes (Signed)
Patient ID: Brooke Leach, female   DOB: Sep 28, 1945, 73 y.o.   MRN: [id number] History of Present Illness: Brooke Leach is a 73 year old white female, married, sp hysterectomy in for well woman gyn exam. She is still working and helps care for 30 year old grand daughter. Husband has had some health issues but doing well now.  PCP is Dr Ouida Sills.   Current Medications, Allergies, Past Medical History, Past Surgical History, Family History and Social History were reviewed in [name] record.     Review of Systems: Patient denies any headaches, hearing loss, fatigue, blurred vision, shortness of breath, chest pain, abdominal pain, problems with bowel movements, urination, or intercourse(not active). No joint pain or mood swings.    Physical Exam:BP 131/80 (BP Location: Left Arm, Patient Position: Sitting, Cuff Size: Normal)   Pulse 85   Ht 5' 3.5" (1.613 m)   Wt 172 lb (78 kg)   BMI 29.99 kg/m  General:  Well developed, well nourished, no acute distress Skin:  Warm and dry,increased number of moles Neck:  Midline trachea, normal thyroid, good ROM, no lymphadenopathy,no carotid bruits heard Lungs; Clear to auscultation bilaterally Breast:  No dominant palpable mass, retraction, or nipple discharge Cardiovascular: Regular rate and rhythm Abdomen:  Soft, non tender, no hepatosplenomegaly Pelvic:  External genitalia is normal in appearance, no lesions.  The vagina is normal in appearance, for age, with loss of color, moisture and rugae. Urethra has no lesions or masses. The cervix and uterus are absent.  No adnexal masses or tenderness noted.Bladder is non tender, no masses felt. Rectal: Good sphincter tone, no polyps, or hemorrhoids felt.  Hemoccult negative. Extremities/musculoskeletal:  No swelling or varicosities noted, no clubbing or cyanosis Psych:  No mood changes, alert and cooperative,seems happy Fall risk is low. PHQ 2 score 0. Examination chaperoned by Malachy Mood LPN She declines DEXA scan, Will check labs fasting, order given.  Impression: 1. Encounter for well woman exam with routine gynecological exam   2. Screening for colorectal cancer   3. Hypothyroidism, unspecified type   4. Screening cholesterol level       Plan: Check CBC,CMP,TSH and lipids Physical in 2 years Mammogram yearly Colonoscopy per GI Continue synthroid, has refills

## 2018-05-07 LAB — COMPREHENSIVE METABOLIC PANEL
A/G RATIO: 1.5 (ref 1.2–2.2)
ALT: 22 IU/L (ref 0–32)
AST: 19 IU/L (ref 0–40)
Albumin: 4.1 g/dL (ref 3.7–4.7)
Alkaline Phosphatase: 77 IU/L (ref 39–117)
BUN/Creatinine Ratio: 16 (ref 12–28)
BUN: 19 mg/dL (ref 8–27)
Bilirubin Total: 0.7 mg/dL (ref 0.0–1.2)
CO2: 26 mmol/L (ref 20–29)
Calcium: 9.6 mg/dL (ref 8.7–10.3)
Chloride: 102 mmol/L (ref 96–106)
Creatinine, Ser: 1.21 mg/dL — ABNORMAL HIGH (ref 0.57–1.00)
GFR, EST AFRICAN AMERICAN: 51 mL/min/{1.73_m2} — AB (ref >59)
GFR, EST NON AFRICAN AMERICAN: 44 mL/min/{1.73_m2} — AB (ref >59)
Globulin, Total: 2.8 g/dL (ref 1.5–4.5)
Glucose: 101 mg/dL — ABNORMAL HIGH (ref 65–99)
POTASSIUM: 4.5 mmol/L (ref 3.5–5.2)
Sodium: 139 mmol/L (ref 134–144)
Total Protein: 6.9 g/dL (ref 6.0–8.5)

## 2018-05-07 LAB — LIPID PANEL
CHOL/HDL RATIO: 3.2 ratio (ref 0.0–4.4)
Cholesterol, Total: 181 mg/dL (ref 100–199)
HDL: 56 mg/dL (ref >39)
LDL Calculated: 99 mg/dL (ref 0–99)
Triglycerides: 130 mg/dL (ref 0–149)
VLDL Cholesterol Cal: 26 mg/dL (ref 5–40)

## 2018-05-07 LAB — CBC
Hematocrit: 44.3 % (ref 34.0–46.6)
Hemoglobin: 14.9 g/dL (ref 11.1–15.9)
MCH: 32.1 pg (ref 26.6–33.0)
MCHC: 33.6 g/dL (ref 31.5–35.7)
MCV: 96 fL (ref 79–97)
PLATELETS: 229 10*3/uL (ref 150–450)
RBC: 4.64 x10E6/uL (ref 3.77–5.28)
RDW: 12.3 % (ref 11.7–15.4)
WBC: 5.7 10*3/uL (ref 3.4–10.8)

## 2018-05-07 LAB — TSH: TSH: 3.05 u[IU]/mL (ref 0.450–4.500)

## 2018-05-09 ENCOUNTER — Telehealth: Payer: Self-pay | Admitting: Adult Health

## 2018-05-09 DIAGNOSIS — R7989 Other specified abnormal findings of blood chemistry: Secondary | ICD-10-CM

## 2018-05-09 NOTE — Telephone Encounter (Signed)
Left message with husband to call me.

## 2018-05-09 NOTE — Telephone Encounter (Signed)
Brooke Leach is aware of labs and that creat, is elevated, will hydrate and re- check this 3/5

## 2018-05-13 ENCOUNTER — Telehealth: Payer: Self-pay | Admitting: Adult Health

## 2018-05-13 LAB — COMPREHENSIVE METABOLIC PANEL
ALT: 20 IU/L (ref 0–32)
AST: 23 IU/L (ref 0–40)
Albumin/Globulin Ratio: 1.5 (ref 1.2–2.2)
Albumin: 4.3 g/dL (ref 3.7–4.7)
Alkaline Phosphatase: 77 IU/L (ref 39–117)
BUN/Creatinine Ratio: 12 (ref 12–28)
BUN: 13 mg/dL (ref 8–27)
Bilirubin Total: 0.6 mg/dL (ref 0.0–1.2)
CO2: 31 mmol/L — AB (ref 20–29)
CREATININE: 1.05 mg/dL — AB (ref 0.57–1.00)
Calcium: 9.6 mg/dL (ref 8.7–10.3)
Chloride: 102 mmol/L (ref 96–106)
GFR calc non Af Amer: 53 mL/min/{1.73_m2} — ABNORMAL LOW (ref >59)
GFR, EST AFRICAN AMERICAN: 61 mL/min/{1.73_m2} (ref >59)
Globulin, Total: 2.8 g/dL (ref 1.5–4.5)
Glucose: 86 mg/dL (ref 65–99)
Potassium: 4.6 mmol/L (ref 3.5–5.2)
Sodium: 141 mmol/L (ref 134–144)
TOTAL PROTEIN: 7.1 g/dL (ref 6.0–8.5)

## 2018-05-13 NOTE — Telephone Encounter (Signed)
Pt aware that creatinine was 1.21 and now 1.05 which is better, make sure staying hydrated.

## 2018-05-25 ENCOUNTER — Other Ambulatory Visit: Payer: Self-pay | Admitting: Adult Health

## 2018-11-27 ENCOUNTER — Other Ambulatory Visit: Payer: Self-pay | Admitting: Adult Health

## 2019-04-11 ENCOUNTER — Other Ambulatory Visit (HOSPITAL_COMMUNITY): Payer: Self-pay | Admitting: Internal Medicine

## 2019-04-11 DIAGNOSIS — Z1231 Encounter for screening mammogram for malignant neoplasm of breast: Secondary | ICD-10-CM

## 2019-05-04 ENCOUNTER — Ambulatory Visit (HOSPITAL_COMMUNITY)
Admission: RE | Admit: 2019-05-04 | Discharge: 2019-05-04 | Disposition: A | Discharge status: Home / Self Care | Payer: Medicare Other | Source: Ambulatory Visit | Attending: Internal Medicine | Admitting: Internal Medicine

## 2019-05-04 ENCOUNTER — Other Ambulatory Visit: Payer: Self-pay

## 2019-05-04 DIAGNOSIS — Z1231 Encounter for screening mammogram for malignant neoplasm of breast: Secondary | ICD-10-CM | POA: Diagnosis not present

## 2019-05-05 ENCOUNTER — Telehealth: Payer: Self-pay | Admitting: *Deleted

## 2019-05-05 NOTE — Telephone Encounter (Signed)
Left message that I called and to call me back on monday

## 2019-05-05 NOTE — Telephone Encounter (Signed)
Pt requesting that Victorino Dike call her to discuss some labs.

## 2019-05-08 ENCOUNTER — Telehealth: Payer: Self-pay | Admitting: Adult Health

## 2019-05-08 DIAGNOSIS — Z13220 Encounter for screening for lipoid disorders: Secondary | ICD-10-CM

## 2019-05-08 DIAGNOSIS — E039 Hypothyroidism, unspecified: Secondary | ICD-10-CM

## 2019-05-08 DIAGNOSIS — R7989 Other specified abnormal findings of blood chemistry: Secondary | ICD-10-CM

## 2019-05-08 NOTE — Addendum Note (Signed)
Addended by: Cyril Mourning A on: 05/08/2019 05:55 PM   Modules accepted: Orders

## 2019-05-08 NOTE — Telephone Encounter (Signed)
Left message that orders in for labs

## 2019-05-08 NOTE — Telephone Encounter (Signed)
Brooke Leach said she was not due to come back and see you til next year but wanted to see if she was needing any labs done this year. She wants to have Thyroid tested again. Please advise when she needs to have this done. If you just need her on the schedule just let me know and I can call her and see what day she wants to come. Just wanted to make sure she did need before I mad appointment.

## 2019-05-11 LAB — COMPREHENSIVE METABOLIC PANEL
ALT: 22 IU/L (ref 0–32)
AST: 23 IU/L (ref 0–40)
Albumin/Globulin Ratio: 1.6 (ref 1.2–2.2)
Albumin: 4.2 g/dL (ref 3.7–4.7)
Alkaline Phosphatase: 72 IU/L (ref 39–117)
BUN/Creatinine Ratio: 18 (ref 12–28)
BUN: 21 mg/dL (ref 8–27)
Bilirubin Total: 0.7 mg/dL (ref 0.0–1.2)
CO2: 23 mmol/L (ref 20–29)
Calcium: 9.6 mg/dL (ref 8.7–10.3)
Chloride: 101 mmol/L (ref 96–106)
Creatinine, Ser: 1.2 mg/dL — ABNORMAL HIGH (ref 0.57–1.00)
GFR calc Af Amer: 51 mL/min/{1.73_m2} — ABNORMAL LOW (ref >59)
GFR calc non Af Amer: 45 mL/min/{1.73_m2} — ABNORMAL LOW (ref >59)
Globulin, Total: 2.6 g/dL (ref 1.5–4.5)
Glucose: 90 mg/dL (ref 65–99)
Potassium: 4.3 mmol/L (ref 3.5–5.2)
Sodium: 143 mmol/L (ref 134–144)
Total Protein: 6.8 g/dL (ref 6.0–8.5)

## 2019-05-11 LAB — LIPID PANEL
Chol/HDL Ratio: 3.3 ratio (ref 0.0–4.4)
Cholesterol, Total: 180 mg/dL (ref 100–199)
HDL: 55 mg/dL (ref >39)
LDL Chol Calc (NIH): 102 mg/dL — ABNORMAL HIGH (ref 0–99)
Triglycerides: 132 mg/dL (ref 0–149)
VLDL Cholesterol Cal: 23 mg/dL (ref 5–40)

## 2019-05-11 LAB — TSH: TSH: 3.67 u[IU]/mL (ref 0.450–4.500)

## 2019-05-12 ENCOUNTER — Telehealth: Payer: Self-pay | Admitting: Adult Health

## 2019-05-12 NOTE — Telephone Encounter (Signed)
Pt aware that creatinine still elevated, and I spoke with kidney office, will check urine for protein and repeat creatinine in 4 weeks

## 2019-05-16 ENCOUNTER — Other Ambulatory Visit: Payer: Medicare Other

## 2019-05-16 ENCOUNTER — Other Ambulatory Visit: Payer: Self-pay

## 2019-05-22 ENCOUNTER — Other Ambulatory Visit: Payer: Self-pay | Admitting: Adult Health

## 2019-05-22 ENCOUNTER — Other Ambulatory Visit: Payer: Self-pay | Admitting: Women's Health

## 2019-05-22 ENCOUNTER — Other Ambulatory Visit: Payer: Medicare Other

## 2019-06-08 ENCOUNTER — Telehealth: Payer: Self-pay | Admitting: *Deleted

## 2019-06-08 NOTE — Telephone Encounter (Signed)
I contacted pt to come by the office one day next week to leave a urine sample so we can send to lab for UA. Pt voiced understanding. JSY

## 2019-06-12 ENCOUNTER — Other Ambulatory Visit: Payer: Medicare Other

## 2019-06-12 ENCOUNTER — Other Ambulatory Visit: Payer: Self-pay

## 2019-06-12 DIAGNOSIS — R7989 Other specified abnormal findings of blood chemistry: Secondary | ICD-10-CM

## 2019-06-13 LAB — URINALYSIS, ROUTINE W REFLEX MICROSCOPIC
Bilirubin, UA: NEGATIVE
Glucose, UA: NEGATIVE
Ketones, UA: NEGATIVE
Leukocytes,UA: NEGATIVE
Nitrite, UA: NEGATIVE
Protein,UA: NEGATIVE
RBC, UA: NEGATIVE
Specific Gravity, UA: 1.023 (ref 1.005–1.030)
Urobilinogen, Ur: 0.2 mg/dL (ref 0.2–1.0)
pH, UA: 5 (ref 5.0–7.5)

## 2019-06-13 LAB — COMPREHENSIVE METABOLIC PANEL
ALT: 21 IU/L (ref 0–32)
AST: 28 IU/L (ref 0–40)
Albumin/Globulin Ratio: 1.5 (ref 1.2–2.2)
Albumin: 4.1 g/dL (ref 3.7–4.7)
Alkaline Phosphatase: 73 IU/L (ref 39–117)
BUN/Creatinine Ratio: 14 (ref 12–28)
BUN: 16 mg/dL (ref 8–27)
Bilirubin Total: 0.6 mg/dL (ref 0.0–1.2)
CO2: 24 mmol/L (ref 20–29)
Calcium: 9.8 mg/dL (ref 8.7–10.3)
Chloride: 103 mmol/L (ref 96–106)
Creatinine, Ser: 1.11 mg/dL — ABNORMAL HIGH (ref 0.57–1.00)
GFR calc Af Amer: 57 mL/min/{1.73_m2} — ABNORMAL LOW (ref >59)
GFR calc non Af Amer: 49 mL/min/{1.73_m2} — ABNORMAL LOW (ref >59)
Globulin, Total: 2.7 g/dL (ref 1.5–4.5)
Glucose: 97 mg/dL (ref 65–99)
Potassium: 4.5 mmol/L (ref 3.5–5.2)
Sodium: 142 mmol/L (ref 134–144)
Total Protein: 6.8 g/dL (ref 6.0–8.5)

## 2019-06-15 ENCOUNTER — Telehealth: Payer: Self-pay | Admitting: Adult Health

## 2019-06-15 NOTE — Telephone Encounter (Signed)
Patient called back and stated that she was returning Jennifer's phone call

## 2019-06-15 NOTE — Telephone Encounter (Signed)
Pt aware that urine showed no protein and creatinine is better, but I think seeing the nephrologist is a good idea, will refer to [facility]

## 2019-06-15 NOTE — Telephone Encounter (Signed)
Left message to call me back, urine had no protein and creatinine  was going down, but want to talk more

## 2019-06-15 NOTE — Telephone Encounter (Signed)
Left message to call me.

## 2019-07-17 ENCOUNTER — Telehealth: Payer: Self-pay | Admitting: Adult Health

## 2019-07-17 NOTE — Telephone Encounter (Signed)
Called to check on Brooke Leach, and she has appt with [facility] 07/27/19

## 2019-08-26 ENCOUNTER — Other Ambulatory Visit: Payer: Self-pay

## 2019-08-26 ENCOUNTER — Emergency Department (HOSPITAL_COMMUNITY)
Admission: EM | Admit: 2019-08-26 | Discharge: 2019-08-26 | Disposition: A | Discharge status: Home / Self Care | Payer: Medicare Other | Attending: Emergency Medicine | Admitting: Emergency Medicine

## 2019-08-26 ENCOUNTER — Encounter (HOSPITAL_COMMUNITY): Payer: Self-pay | Admitting: Emergency Medicine

## 2019-08-26 DIAGNOSIS — M79604 Pain in right leg: Secondary | ICD-10-CM

## 2019-08-26 DIAGNOSIS — E039 Hypothyroidism, unspecified: Secondary | ICD-10-CM | POA: Insufficient documentation

## 2019-08-26 DIAGNOSIS — M79661 Pain in right lower leg: Secondary | ICD-10-CM | POA: Diagnosis not present

## 2019-08-26 LAB — CBC WITH DIFFERENTIAL/PLATELET
Abs Immature Granulocytes: 0.01 10*3/uL (ref 0.00–0.07)
Basophils Absolute: 0 10*3/uL (ref 0.0–0.1)
Basophils Relative: 0 %
Eosinophils Absolute: 0.2 10*3/uL (ref 0.0–0.5)
Eosinophils Relative: 2 %
HCT: 45.1 % (ref 36.0–46.0)
Hemoglobin: 14.7 g/dL (ref 12.0–15.0)
Immature Granulocytes: 0 %
Lymphocytes Relative: 19 %
Lymphs Abs: 1.4 10*3/uL (ref 0.7–4.0)
MCH: 32.2 pg (ref 26.0–34.0)
MCHC: 32.6 g/dL (ref 30.0–36.0)
MCV: 98.7 fL (ref 80.0–100.0)
Monocytes Absolute: 0.5 10*3/uL (ref 0.1–1.0)
Monocytes Relative: 7 %
Neutro Abs: 5.2 10*3/uL (ref 1.7–7.7)
Neutrophils Relative %: 72 %
Platelets: 224 10*3/uL (ref 150–400)
RBC: 4.57 MIL/uL (ref 3.87–5.11)
RDW: 12.3 % (ref 11.5–15.5)
WBC: 7.3 10*3/uL (ref 4.0–10.5)
nRBC: 0 % (ref 0.0–0.2)

## 2019-08-26 LAB — BASIC METABOLIC PANEL
Anion gap: 8 (ref 5–15)
BUN: 23 mg/dL (ref 8–23)
CO2: 27 mmol/L (ref 22–32)
Calcium: 9.4 mg/dL (ref 8.9–10.3)
Chloride: 104 mmol/L (ref 98–111)
Creatinine, Ser: 1.11 mg/dL — ABNORMAL HIGH (ref 0.44–1.00)
GFR calc Af Amer: 57 mL/min — ABNORMAL LOW (ref >60)
GFR calc non Af Amer: 49 mL/min — ABNORMAL LOW (ref >60)
Glucose, Bld: 99 mg/dL (ref 70–99)
Potassium: 4.4 mmol/L (ref 3.5–5.1)
Sodium: 139 mmol/L (ref 135–145)

## 2019-08-26 LAB — CK: Total CK: 158 U/L (ref 38–234)

## 2019-08-26 LAB — MAGNESIUM: Magnesium: 2.2 mg/dL (ref 1.7–2.4)

## 2019-08-26 MED ORDER — METHOCARBAMOL 500 MG PO TABS: 500.0000 mg | ORAL_TABLET | Freq: Two times a day (BID) | ORAL | 0 refills | Status: DC

## 2019-08-26 NOTE — ED Triage Notes (Signed)
Awakened Thurs night with severe R leg cramp   Thought she could walk it off   Walking on leg has been painful since them but is worse today   Pain to her R lower leg with walking   PCP is fagan - has not contacted

## 2019-08-26 NOTE — Discharge Instructions (Signed)
Prescribed you with a muscle relaxer.  You may also take additional anti-inflammatory such as ibuprofen.  Your lab work did not show any acute abnormality here in the ED  We do not have ultrasound available this evening however it is available from 8-12 tomorrow.  I have placed orders for this imaging study.  Please follow directions.  Return if you have any new or worsening symptoms.

## 2019-08-26 NOTE — ED Provider Notes (Signed)
[facility] EMERGENCY DEPARTMENT Provider Note   CSN: [id number] Arrival date & time: 08/26/19  1422     History Chief Complaint  Patient presents with   Leg Pain    R    Brooke Leach is a 74 y.o. female with no significant past medical history who presents for evaluation of right lower extremity leg cramping.  Patient states symptoms started Thursday, 3 days PTA.  Has not take anything for symptoms.  Thought she could "walk it off."  Walking is painful.  No associated redness, swelling or warmth.  No prior history of PE or DVT.  No chest pain, shortness of breath.  No emesis.  No recent trauma or injury.  No prior similar symptoms.  She has no pain when she is not moving.  Rates her pain a 6/10 when ambulating.  Denies additional aggravating relieving factors.  Denies fever, chills, nausea, vomiting, headache, lightness, dizziness, chest pain, shortness of breath, lateral leg swelling, redness, warmth, rashes, lesions, bony pain, decreased range of motion, paresthesias, back pain.   History obtained from patient and past medical records. No interpreter is used.  HPI     Past Medical History:  Diagnosis Date   Hemorrhoids 12/17/2014   Hypothyroid 12/11/2013   Positive fecal occult blood test 12/17/2014   Thyroid disease     Patient Active Problem List   Diagnosis Date Noted   Screening for colorectal cancer 05/05/2018   Encounter for well woman exam with routine gynecological exam 05/05/2018   Screening cholesterol level 05/05/2018   Positive fecal occult blood test 12/17/2014   Hemorrhoids 12/17/2014   Hypothyroid 12/11/2013    Past Surgical History:  Procedure Laterality Date   ABDOMINAL HYSTERECTOMY       OB History    Gravida  1   Para  1   Term      Preterm      AB      Living  1     SAB      TAB      Ectopic      Multiple      Live Births  1           Family History  Problem Relation Age of Onset   Hypertension Mother     Other Father        pancreas issues   Thyroid disease Sister    Thyroid disease Sister     Social History   Tobacco Use   Smoking status: Never Smoker   Smokeless tobacco: Never Used  Vaping Use   Vaping Use: Never used  Substance Use Topics   Alcohol use: No   Drug use: No    Home Medications Prior to Admission medications   Medication Sig Start Date End Date Taking? Authorizing Provider  levothyroxine (SYNTHROID) 25 MCG tablet TAKE 2 TABLETS BY MOUTH EVERY DAY 05/22/19   Cyril Mourning A, NP  methocarbamol (ROBAXIN) 500 MG tablet Take 1 tablet (500 mg total) by mouth 2 (two) times daily. 08/26/19   Khiana Camino A, PA-C  Multiple Vitamin (MULTIVITAMIN) tablet Take 1 tablet by mouth daily.    [provider]    Allergies    Patient has no known allergies.  Review of Systems   Review of Systems  Constitutional: Negative.   HENT: Negative.   Respiratory: Negative.   Cardiovascular: Negative.   Gastrointestinal: Negative.   Genitourinary: Negative.   Musculoskeletal: Negative for arthralgias, back pain, gait problem, joint swelling, myalgias,  neck pain and neck stiffness.       Right calf pain  Skin: Negative.   Neurological: Negative.   All other systems reviewed and are negative.  Physical Exam Updated Vital Signs BP (!) 151/71 (BP Location: Right Arm)    Pulse 86    Temp 98.4 F (36.9 C) (Temporal)    Resp 18    Ht 5\' 1"  (1.549 m)    Wt 77.1 kg Comment: cannot stand without support   SpO2 97%    BMI 32.12 kg/m   Physical Exam Vitals and nursing note reviewed.  Constitutional:      General: She is not in acute distress.    Appearance: She is well-developed. She is not ill-appearing, toxic-appearing or diaphoretic.  HENT:     Head: Normocephalic and atraumatic.     Nose: Nose normal.     Mouth/Throat:     Mouth: Mucous membranes are moist.  Eyes:     Pupils: Pupils are equal, round, and reactive to light.  Cardiovascular:     Rate  and Rhythm: Normal rate.     Pulses: Normal pulses.          Dorsalis pedis pulses are 2+ on the right side and 2+ on the left side.       Posterior tibial pulses are 2+ on the right side and 2+ on the left side.     Heart sounds: Normal heart sounds.  Pulmonary:     Effort: Pulmonary effort is normal. No respiratory distress.     Breath sounds: Normal breath sounds.  Abdominal:     General: Bowel sounds are normal. There is no distension.     Palpations: There is no mass.     Tenderness: There is no abdominal tenderness. There is no right CVA tenderness, left CVA tenderness, guarding or rebound.     Hernia: No hernia is present.  Musculoskeletal:        General: Tenderness present. No swelling, deformity or signs of injury. Normal range of motion.     Cervical back: Normal range of motion.     Thoracic back: Normal.     Lumbar back: Normal.     Right hip: Normal.     Left hip: Normal.     Right upper leg: Normal.     Left upper leg: Normal.     Right knee: Normal.     Left knee: Normal.     Right lower leg: Tenderness present. No edema.     Left lower leg: Normal. No edema.     Right ankle: Normal.     Left ankle: Normal.       Legs:     Comments: Tenderness to right posterior calf extending into popliteal fossa.  Able to flex and extend at bilateral knees without difficulty.  No bony tenderness to lower extremities.  Wiggles toes without difficulty.  Able to plantarflex and dorsiflex without difficulty.  Thompson test negative.  No edema, erythema or warmth.  Feet:     Right foot:     Skin integrity: Skin integrity normal.     Left foot:     Skin integrity: Skin integrity normal.  Skin:    General: Skin is warm and dry.     Capillary Refill: Capillary refill takes less than 2 seconds.     Comments: Brisk capillary refill.  No edema, erythema or warmth.  No fluctuance or induration.  Neurological:     Mental Status: She is alert.  Cranial Nerves: Cranial nerves are  intact.     Sensory: Sensation is intact.     Motor: Motor function is intact.     Coordination: Coordination is intact.     Gait: Gait is intact.     Comments: Ambulatory without difficulty. 5/5 strength to BLE Intact sensation to BLE    ED Results / Procedures / Treatments   Labs (all labs ordered are listed, but only abnormal results are displayed) Labs Reviewed  BASIC METABOLIC PANEL - Abnormal; Notable for the following components:      Result Value   Creatinine, Ser 1.11 (*)    GFR calc non Af Amer 49 (*)    GFR calc Af Amer 57 (*)    All other components within normal limits  CBC WITH DIFFERENTIAL/PLATELET  MAGNESIUM  CK    EKG None  Radiology No results found.  Procedures Procedures (including critical care time)  Medications Ordered in ED Medications - No data to display  ED Course  I have reviewed the triage vital signs and the nursing notes.  Pertinent labs & imaging results that were available during my care of the patient were reviewed by me and considered in my medical decision making (see chart for details).  74 year old presents for evaluation of right calf pain.  She is afebrile, nonseptic, not ill-appearing.  No prior history of PE or DVT.  No chest pain, shortness of breath.  She has no tachycardia, tachypnea or hypoxia.  She has no overlying skin changes.  Tenderness to right posterior calf extending popliteal fossa.  She has a normal musculoskeletal exam.  She is neurovascularly intact.  She is able to ambulate without difficulty.  No recent trauma or injury.  Pain occurs with movement.  She has no bony tenderness.  Plan on labs and reassess  CBC without leukocytosis Metabolic panel without electrolyte abnormality CK 158 Magnesium 2.2  Do not feel patient needs x-ray imaging at this time however would benefit from ultrasound which we do not have available to rule out DVT.  Low suspicion for Achilles tendon rupture.  No overlying skin changes to  suggest infectious process.  No bony tenderness to indicate fracture or dislocation.  Low suspicion for septic joint, gout, myositis.  Will DC home with muscle relaxers and follow-up for ultrasound tomorrow to rule out DVT.  I discussed one-time dose of anticoagulant which patient declined.   The patient has been appropriately medically screened and/or stabilized in the ED. I have low suspicion for any other emergent medical condition which would require further screening, evaluation or treatment in the ED or require inpatient management.  Patient is hemodynamically stable and in no acute distress.  Patient able to ambulate in department prior to ED.  Evaluation does not show acute pathology that would require ongoing or additional emergent interventions while in the emergency department or further inpatient treatment.  I have discussed the diagnosis with the patient and answered all questions.  Pain is been managed while in the emergency department and patient has no further complaints prior to discharge.  Patient is comfortable with plan discussed in room and is stable for discharge at this time.  I have discussed strict return precautions for returning to the emergency department.  Patient was encouraged to follow-up with PCP/specialist refer to at discharge.   Patient discussed with attending, Dr. Estell Harpin who agrees with the treatment, plan disposition.      MDM Rules/Calculators/A&P  Final Clinical Impression(s) / ED Diagnoses Final diagnoses:  Right leg pain    Rx / DC Orders ED Discharge Orders         Ordered    US Venous Img Lower Unilateral Right     Discontinue     08/26/19 1703    methocarbamol (ROBAXIN) 500 MG tablet  2 times daily     Discontinue  Reprint     08/26/19 1703           Sadie Pickar A, PA-C 08/26/19 1704    Bethann Berkshire, MD 08/28/19 1047

## 2019-08-27 ENCOUNTER — Other Ambulatory Visit: Payer: Self-pay

## 2019-08-27 ENCOUNTER — Ambulatory Visit (HOSPITAL_COMMUNITY)
Admission: RE | Admit: 2019-08-27 | Discharge: 2019-08-27 | Disposition: A | Discharge status: Home / Self Care | Payer: Medicare Other | Source: Ambulatory Visit | Attending: Physician Assistant | Admitting: Physician Assistant

## 2019-08-27 DIAGNOSIS — M79604 Pain in right leg: Secondary | ICD-10-CM | POA: Insufficient documentation

## 2019-08-27 DIAGNOSIS — R936 Abnormal findings on diagnostic imaging of limbs: Secondary | ICD-10-CM | POA: Insufficient documentation

## 2019-08-27 NOTE — ED Provider Notes (Signed)
Patient returns today for scheduled outpatient venous ultrasound of the right lower extremity.  Ultrasound negative for evidence of DVT.  Discussed findings with patient.  Also discussed possible Baker's cyst.  She agrees to treatment plan as discussed on prior visit and close follow-up with PCP.    US Venous Img Lower Unilateral Right  Result Date: 08/27/2019 CLINICAL DATA:  Right lower extremity pain 1 week. EXAM: RIGHT LOWER EXTREMITY VENOUS DOPPLER ULTRASOUND TECHNIQUE: Gray-scale sonography with compression, as well as color and duplex ultrasound, were performed to evaluate the deep venous system(s) from the level of the common femoral vein through the popliteal and proximal calf veins. COMPARISON:  None. FINDINGS: VENOUS Normal compressibility of the common femoral, superficial femoral, and popliteal veins, as well as the visualized calf veins. Visualized portions of profunda femoral vein and great saphenous vein unremarkable. No filling defects to suggest DVT on grayscale or color Doppler imaging. Doppler waveforms show normal direction of venous flow, normal respiratory plasticity and response to augmentation. Limited views of the contralateral common femoral vein are unremarkable. OTHER Elongated 5.5 cm cystic structure over the right popliteal fossa likely Baker's cyst. Limitations: none IMPRESSION: 1.  No evidence of right lower extremity deep vein thrombosis. 2. 5.5 cm elongated cystic structure over the right popliteal fossa likely Baker's cyst. Electronically Signed   By: Elberta Fortis M.D.   On: 08/27/2019 10:37      Pauline Aus, PA-C 08/27/19 1213    Terald Sleeper, MD 08/27/19 Windell Moment

## 2019-09-06 ENCOUNTER — Other Ambulatory Visit (HOSPITAL_COMMUNITY): Payer: Self-pay | Admitting: Nephrology

## 2019-09-06 ENCOUNTER — Other Ambulatory Visit: Payer: Self-pay | Admitting: Nephrology

## 2019-09-06 DIAGNOSIS — N1831 Chronic kidney disease, stage 3a: Secondary | ICD-10-CM

## 2019-09-13 ENCOUNTER — Other Ambulatory Visit: Payer: Self-pay

## 2019-09-13 ENCOUNTER — Ambulatory Visit (HOSPITAL_COMMUNITY)
Admission: RE | Admit: 2019-09-13 | Discharge: 2019-09-13 | Disposition: A | Discharge status: Home / Self Care | Payer: Medicare Other | Source: Ambulatory Visit | Attending: Nephrology | Admitting: Nephrology

## 2019-09-13 DIAGNOSIS — N1831 Chronic kidney disease, stage 3a: Secondary | ICD-10-CM | POA: Insufficient documentation

## 2019-11-09 ENCOUNTER — Other Ambulatory Visit: Payer: Self-pay | Admitting: Adult Health

## 2020-03-22 ENCOUNTER — Other Ambulatory Visit (HOSPITAL_COMMUNITY): Payer: Self-pay | Admitting: Adult Health

## 2020-03-22 DIAGNOSIS — Z1231 Encounter for screening mammogram for malignant neoplasm of breast: Secondary | ICD-10-CM

## 2020-05-02 ENCOUNTER — Other Ambulatory Visit: Payer: Self-pay | Admitting: Adult Health

## 2020-05-06 ENCOUNTER — Other Ambulatory Visit: Payer: Self-pay

## 2020-05-06 ENCOUNTER — Ambulatory Visit (HOSPITAL_COMMUNITY)
Admission: RE | Admit: 2020-05-06 | Discharge: 2020-05-06 | Disposition: A | Discharge status: Home / Self Care | Payer: Medicare Other | Source: Ambulatory Visit | Attending: Adult Health | Admitting: Adult Health

## 2020-05-06 DIAGNOSIS — Z1231 Encounter for screening mammogram for malignant neoplasm of breast: Secondary | ICD-10-CM | POA: Diagnosis not present

## 2020-05-07 ENCOUNTER — Other Ambulatory Visit: Payer: Medicare Other

## 2020-05-07 DIAGNOSIS — E039 Hypothyroidism, unspecified: Secondary | ICD-10-CM | POA: Diagnosis not present

## 2020-05-07 DIAGNOSIS — R7989 Other specified abnormal findings of blood chemistry: Secondary | ICD-10-CM | POA: Diagnosis not present

## 2020-05-08 LAB — COMPREHENSIVE METABOLIC PANEL
ALT: 16 IU/L (ref 0–32)
AST: 21 IU/L (ref 0–40)
Albumin/Globulin Ratio: 1.5 (ref 1.2–2.2)
Albumin: 4.3 g/dL (ref 3.7–4.7)
Alkaline Phosphatase: 73 IU/L (ref 44–121)
BUN/Creatinine Ratio: 15 (ref 12–28)
BUN: 17 mg/dL (ref 8–27)
Bilirubin Total: 0.6 mg/dL (ref 0.0–1.2)
CO2: 24 mmol/L (ref 20–29)
Calcium: 9.9 mg/dL (ref 8.7–10.3)
Chloride: 102 mmol/L (ref 96–106)
Creatinine, Ser: 1.1 mg/dL — ABNORMAL HIGH (ref 0.57–1.00)
Globulin, Total: 2.8 g/dL (ref 1.5–4.5)
Glucose: 101 mg/dL — ABNORMAL HIGH (ref 65–99)
Potassium: 4.4 mmol/L (ref 3.5–5.2)
Sodium: 142 mmol/L (ref 134–144)
Total Protein: 7.1 g/dL (ref 6.0–8.5)
eGFR: 52 mL/min/{1.73_m2} — ABNORMAL LOW (ref >59)

## 2020-05-08 LAB — TSH: TSH: 2.7 u[IU]/mL (ref 0.450–4.500)

## 2020-05-09 ENCOUNTER — Ambulatory Visit (INDEPENDENT_AMBULATORY_CARE_PROVIDER_SITE_OTHER): Payer: Medicare Other | Admitting: Adult Health

## 2020-05-09 ENCOUNTER — Other Ambulatory Visit: Payer: Self-pay

## 2020-05-09 ENCOUNTER — Encounter: Payer: Self-pay | Admitting: Adult Health

## 2020-05-09 VITALS — BP 149/89 | HR 81 | Ht 62.0 in | Wt 176.0 lb

## 2020-05-09 DIAGNOSIS — Z01419 Encounter for gynecological examination (general) (routine) without abnormal findings: Secondary | ICD-10-CM | POA: Diagnosis not present

## 2020-05-09 DIAGNOSIS — E7800 Pure hypercholesterolemia, unspecified: Secondary | ICD-10-CM

## 2020-05-09 DIAGNOSIS — Z780 Asymptomatic menopausal state: Secondary | ICD-10-CM

## 2020-05-09 DIAGNOSIS — R7989 Other specified abnormal findings of blood chemistry: Secondary | ICD-10-CM

## 2020-05-09 DIAGNOSIS — E039 Hypothyroidism, unspecified: Secondary | ICD-10-CM | POA: Diagnosis not present

## 2020-05-09 DIAGNOSIS — N1831 Chronic kidney disease, stage 3a: Secondary | ICD-10-CM

## 2020-05-09 DIAGNOSIS — Z1211 Encounter for screening for malignant neoplasm of colon: Secondary | ICD-10-CM

## 2020-05-09 LAB — HEMOCCULT GUIAC POC 1CARD (OFFICE): Fecal Occult Blood, POC: NEGATIVE

## 2020-05-09 NOTE — Progress Notes (Signed)
Patient ID: Brooke Leach, female   DOB: [birth year], 75 y.o.   MRN: [id number] History of Present Illness: Brooke Leach is a 75 year old white female,married, sp hysterectomy in for a well woman gyn exam. She still works PT. PCP is Dr Ouida Sills.   Current Medications, Allergies, Past Medical History, Past Surgical History, Family History and Social History were reviewed in [facility].     Review of Systems:  Patient denies any headaches, hearing loss, fatigue, blurred vision, shortness of breath, chest pain, abdominal pain, problems with bowel movements, urination, or intercourse(not active). No joint pain or mood swings.   Physical Exam:BP (!) 149/89 (BP Location: Left Arm, Patient Position: Sitting, Cuff Size: Normal)   Pulse 81   Ht 5\' 2"  (1.575 m)   Wt 176 lb (79.8 kg)   BMI 32.19 kg/m  General:  Well developed, well nourished, no acute distress Skin:  Warm and dry,has numerous AKs Neck:  Midline trachea, normal thyroid, good ROM, no lymphadenopathy,no carotid bruis heard Lungs; Clear to auscultation bilaterally Breast:  No dominant palpable mass, retraction, or nipple discharge Cardiovascular: Regular rate and rhythm Abdomen:  Soft, non tender, no hepatosplenomegaly Pelvic:  External genitalia is normal in appearance, no lesions.  The vagina is pale wih lost of moisture and rugae. Urethra has no lesions or masses. The cervix and uterus are absent.  No adnexal masses or tenderness noted.Bladder is non tender, no masses felt. Rectal: Good sphincter tone, no polyps, or hemorrhoids felt.  Hemoccult negative. Extremities/musculoskeletal:  No swelling,+ varicosities noted, no clubbing or cyanosis Psych:  No mood changes, alert and cooperative,seems happy AA is 0  Fall risk is low PHQ 9 score is 0 GAD 7 score is 0  Upstream - 05/09/20 1150      Pregnancy Intention Screening   Does the patient want to become pregnant in the next year? No    Does the patient's  partner want to become pregnant in the next year? No    Would the patient like to discuss contraceptive options today? No      Contraception Wrap Up   Current Method Female Sterilization   hyst   End Method Female Sterilization   hyst   Contraception Counseling Provided No         Examination chaperoned by 07/09/20 LPN   Impression and Plan: 1. Encounter for well woman exam with routine gynecological exam Physical in 2 years Mammogram yearly Colonoscopy 2027 Has gotten 2 COVID vaccines   2. Encounter for screening fecal occult blood testing   3. Hypothyroidism, unspecified type Continue synthroid 25 mcg, has refills  4. Elevated serum creatinine  sees Dr 2028 in April  5. Stage 3a chronic kidney disease (HCC) Sees Dr 01-10-1978 in April  6. Elevated LDL cholesterol level Check lipids in near future  7. Postmenopause Scheduled DEXA 3/9 at 3 pm at [facility]

## 2020-05-15 ENCOUNTER — Other Ambulatory Visit: Payer: Self-pay

## 2020-05-15 ENCOUNTER — Ambulatory Visit (HOSPITAL_COMMUNITY)
Admission: RE | Admit: 2020-05-15 | Discharge: 2020-05-15 | Disposition: A | Discharge status: Home / Self Care | Payer: Medicare Other | Source: Ambulatory Visit | Attending: Adult Health | Admitting: Adult Health

## 2020-05-15 DIAGNOSIS — M810 Age-related osteoporosis without current pathological fracture: Secondary | ICD-10-CM | POA: Diagnosis not present

## 2020-05-15 DIAGNOSIS — Z780 Asymptomatic menopausal state: Secondary | ICD-10-CM

## 2020-05-24 ENCOUNTER — Telehealth: Payer: Self-pay | Admitting: Adult Health

## 2020-05-24 ENCOUNTER — Encounter: Payer: Self-pay | Admitting: Adult Health

## 2020-05-24 DIAGNOSIS — M810 Age-related osteoporosis without current pathological fracture: Secondary | ICD-10-CM

## 2020-05-24 HISTORY — DX: Age-related osteoporosis without current pathological fracture: M81.0

## 2020-05-24 NOTE — Telephone Encounter (Signed)
Left message that bone density showed some osteoporosis, call me on Monday can talk about options like starting bisphosphonate, calcium and vitamin D

## 2020-05-27 DIAGNOSIS — M810 Age-related osteoporosis without current pathological fracture: Secondary | ICD-10-CM

## 2020-05-27 MED ORDER — IBANDRONATE SODIUM 150 MG PO TABS: 150.0000 mg | ORAL_TABLET | ORAL | 12 refills | Status: DC

## 2020-05-27 NOTE — Telephone Encounter (Signed)
Pt aware of osteoporosis, will rx boniva and recheck DEXA in 2 years

## 2020-06-07 DIAGNOSIS — E559 Vitamin D deficiency, unspecified: Secondary | ICD-10-CM | POA: Diagnosis not present

## 2020-06-07 DIAGNOSIS — N1831 Chronic kidney disease, stage 3a: Secondary | ICD-10-CM | POA: Diagnosis not present

## 2020-06-07 DIAGNOSIS — E7800 Pure hypercholesterolemia, unspecified: Secondary | ICD-10-CM | POA: Diagnosis not present

## 2020-06-07 DIAGNOSIS — I129 Hypertensive chronic kidney disease with stage 1 through stage 4 chronic kidney disease, or unspecified chronic kidney disease: Secondary | ICD-10-CM | POA: Diagnosis not present

## 2020-06-08 LAB — LIPID PANEL
Chol/HDL Ratio: 3.7 ratio (ref 0.0–4.4)
Cholesterol, Total: 186 mg/dL (ref 100–199)
HDL: 50 mg/dL (ref >39)
LDL Chol Calc (NIH): 109 mg/dL — ABNORMAL HIGH (ref 0–99)
Triglycerides: 151 mg/dL — ABNORMAL HIGH (ref 0–149)
VLDL Cholesterol Cal: 27 mg/dL (ref 5–40)

## 2020-06-12 DIAGNOSIS — N1831 Chronic kidney disease, stage 3a: Secondary | ICD-10-CM | POA: Diagnosis not present

## 2020-06-12 DIAGNOSIS — E559 Vitamin D deficiency, unspecified: Secondary | ICD-10-CM | POA: Diagnosis not present

## 2020-06-12 DIAGNOSIS — R809 Proteinuria, unspecified: Secondary | ICD-10-CM | POA: Diagnosis not present

## 2020-06-12 DIAGNOSIS — I129 Hypertensive chronic kidney disease with stage 1 through stage 4 chronic kidney disease, or unspecified chronic kidney disease: Secondary | ICD-10-CM | POA: Diagnosis not present

## 2020-06-26 DIAGNOSIS — E559 Vitamin D deficiency, unspecified: Secondary | ICD-10-CM | POA: Diagnosis not present

## 2020-06-26 DIAGNOSIS — I129 Hypertensive chronic kidney disease with stage 1 through stage 4 chronic kidney disease, or unspecified chronic kidney disease: Secondary | ICD-10-CM | POA: Diagnosis not present

## 2020-06-26 DIAGNOSIS — R809 Proteinuria, unspecified: Secondary | ICD-10-CM | POA: Diagnosis not present

## 2020-06-26 DIAGNOSIS — N1831 Chronic kidney disease, stage 3a: Secondary | ICD-10-CM | POA: Diagnosis not present

## 2020-08-07 DIAGNOSIS — I129 Hypertensive chronic kidney disease with stage 1 through stage 4 chronic kidney disease, or unspecified chronic kidney disease: Secondary | ICD-10-CM | POA: Diagnosis not present

## 2020-08-07 DIAGNOSIS — R809 Proteinuria, unspecified: Secondary | ICD-10-CM | POA: Diagnosis not present

## 2020-08-07 DIAGNOSIS — E559 Vitamin D deficiency, unspecified: Secondary | ICD-10-CM | POA: Diagnosis not present

## 2020-08-07 DIAGNOSIS — N1831 Chronic kidney disease, stage 3a: Secondary | ICD-10-CM | POA: Diagnosis not present

## 2020-08-14 DIAGNOSIS — N182 Chronic kidney disease, stage 2 (mild): Secondary | ICD-10-CM | POA: Diagnosis not present

## 2020-08-14 DIAGNOSIS — I129 Hypertensive chronic kidney disease with stage 1 through stage 4 chronic kidney disease, or unspecified chronic kidney disease: Secondary | ICD-10-CM | POA: Diagnosis not present

## 2020-08-14 DIAGNOSIS — R809 Proteinuria, unspecified: Secondary | ICD-10-CM | POA: Diagnosis not present

## 2020-09-15 DIAGNOSIS — Z20822 Contact with and (suspected) exposure to covid-19: Secondary | ICD-10-CM | POA: Diagnosis not present

## 2020-10-31 ENCOUNTER — Other Ambulatory Visit: Payer: Self-pay | Admitting: Adult Health

## 2020-12-11 DIAGNOSIS — N182 Chronic kidney disease, stage 2 (mild): Secondary | ICD-10-CM | POA: Diagnosis not present

## 2020-12-11 DIAGNOSIS — R809 Proteinuria, unspecified: Secondary | ICD-10-CM | POA: Diagnosis not present

## 2020-12-11 DIAGNOSIS — I129 Hypertensive chronic kidney disease with stage 1 through stage 4 chronic kidney disease, or unspecified chronic kidney disease: Secondary | ICD-10-CM | POA: Diagnosis not present

## 2020-12-16 DIAGNOSIS — Z23 Encounter for immunization: Secondary | ICD-10-CM | POA: Diagnosis not present

## 2020-12-18 DIAGNOSIS — E6609 Other obesity due to excess calories: Secondary | ICD-10-CM | POA: Diagnosis not present

## 2020-12-18 DIAGNOSIS — R809 Proteinuria, unspecified: Secondary | ICD-10-CM | POA: Diagnosis not present

## 2020-12-18 DIAGNOSIS — I129 Hypertensive chronic kidney disease with stage 1 through stage 4 chronic kidney disease, or unspecified chronic kidney disease: Secondary | ICD-10-CM | POA: Diagnosis not present

## 2020-12-18 DIAGNOSIS — N1831 Chronic kidney disease, stage 3a: Secondary | ICD-10-CM | POA: Diagnosis not present

## 2021-01-30 DIAGNOSIS — Z20828 Contact with and (suspected) exposure to other viral communicable diseases: Secondary | ICD-10-CM | POA: Diagnosis not present

## 2021-03-20 ENCOUNTER — Ambulatory Visit
Admission: EM | Admit: 2021-03-20 | Discharge: 2021-03-20 | Disposition: A | Discharge status: Home / Self Care | Payer: Medicare Other | Attending: Urgent Care | Admitting: Urgent Care

## 2021-03-20 ENCOUNTER — Other Ambulatory Visit: Payer: Self-pay

## 2021-03-20 DIAGNOSIS — N183 Chronic kidney disease, stage 3 unspecified: Secondary | ICD-10-CM | POA: Diagnosis not present

## 2021-03-20 DIAGNOSIS — L089 Local infection of the skin and subcutaneous tissue, unspecified: Secondary | ICD-10-CM

## 2021-03-20 DIAGNOSIS — Z23 Encounter for immunization: Secondary | ICD-10-CM | POA: Diagnosis not present

## 2021-03-20 DIAGNOSIS — S60452A Superficial foreign body of right middle finger, initial encounter: Secondary | ICD-10-CM | POA: Diagnosis not present

## 2021-03-20 DIAGNOSIS — S60459A Superficial foreign body of unspecified finger, initial encounter: Secondary | ICD-10-CM

## 2021-03-20 MED ORDER — CEPHALEXIN 500 MG PO CAPS: 500.0000 mg | ORAL_CAPSULE | Freq: Three times a day (TID) | ORAL | 0 refills | Status: DC

## 2021-03-20 MED ORDER — TETANUS-DIPHTH-ACELL PERTUSSIS 5-2.5-18.5 LF-MCG/0.5 IM SUSY
0.5000 mL | PREFILLED_SYRINGE | Freq: Once | INTRAMUSCULAR | Status: AC
  Administered 2021-03-20: 0.5 mL via INTRAMUSCULAR

## 2021-03-20 NOTE — ED Provider Notes (Signed)
[facility]   MRN: [id number] DOB: 1945-08-16  Subjective:   Brooke Leach is a 76 y.o. female presenting for 1 week history of persistent focally tender right middle finger.  Patient initially got a splinter at the finger pad and took it out.  She has been nursing the area but still continues to feel intermittent pain and is worried about some slight swelling that she has had.  No fevers, nausea, vomiting, red streaking, hand pain or swelling.  Cannot recall her last Tdap.  No current facility-administered medications for this encounter.  Current Outpatient Medications:    amLODipine (NORVASC) 2.5 MG tablet, Take 2.5 mg by mouth daily., Disp: , Rfl:    ascorbic acid (VITAMIN C) 500 MG tablet, Take by mouth., Disp: , Rfl:    Cholecalciferol 25 MCG (1000 UT) tablet, Take by mouth., Disp: , Rfl:    ibandronate (BONIVA) 150 MG tablet, Take 1 tablet (150 mg total) by mouth every 30 (thirty) days. Take in the morning with a full glass of water, on an empty stomach, and do not take anything else by mouth or lie down for the next 30 min., Disp: 1 tablet, Rfl: 12   levothyroxine (SYNTHROID) 25 MCG tablet, TAKE 2 TABLETS BY MOUTH EVERY DAY, Disp: 180 tablet, Rfl: 1   loratadine (CLARITIN) 10 MG tablet, Take by mouth., Disp: , Rfl:    Multiple Vitamin (MULTIVITAMIN) tablet, Take 1 tablet by mouth daily., Disp: , Rfl:    Vitamin A 2400 MCG (8000 UT) CAPS, Take by mouth., Disp: , Rfl:    No Known Allergies  Past Medical History:  Diagnosis Date   Chronic kidney disease    stage 3 sees Dr Wolfgang Phoenix   Hemorrhoids 12/17/2014   Hypothyroid 12/11/2013   Osteoporosis 05/24/2020   Positive fecal occult blood test 12/17/2014   Thyroid disease      Past Surgical History:  Procedure Laterality Date   ABDOMINAL HYSTERECTOMY      Family History  Problem Relation Age of Onset   Hypertension Mother    Other Father        pancreas issues   Thyroid disease Sister    Thyroid disease  Sister     Social History   Tobacco Use   Smoking status: Never   Smokeless tobacco: Never  Vaping Use   Vaping Use: Never used  Substance Use Topics   Alcohol use: No   Drug use: No    ROS   Objective:   Vitals: BP (!) 157/68    Pulse 99    Temp 98.2 F (36.8 C)    Resp 20    SpO2 97%   Physical Exam Constitutional:      General: She is not in acute distress.    Appearance: Normal appearance. She is well-developed. She is not ill-appearing.  HENT:     Head: Normocephalic and atraumatic.     Nose: Nose normal.     Mouth/Throat:     Mouth: Mucous membranes are moist.  Eyes:     General: No scleral icterus.    Extraocular Movements: Extraocular movements intact.  Cardiovascular:     Rate and Rhythm: Normal rate.  Pulmonary:     Effort: Pulmonary effort is normal.  Musculoskeletal:       Hands:  Skin:    General: Skin is warm and dry.  Neurological:     General: No focal deficit present.     Mental Status: She is alert and oriented to  person, place, and time.  Psychiatric:        Mood and Affect: Mood normal.        Behavior: Behavior normal.    Tdap in clinic today.  Assessment and Plan :   PDMP not reviewed this encounter.  No diagnosis found.   Creatinine clearance calculated at 61mL/min using last lab draw.  Low suspicion for retained foreign body, felon.  Recommended coverage with Keflex for wound infection.  Follow-up with PCP as soon as possible. Counseled patient on potential for adverse effects with medications prescribed/recommended today, strict ER and return-to-clinic precautions discussed, patient verbalized understanding.    Wallis Bamberg, [place] 03/20/21 1538

## 2021-03-20 NOTE — ED Triage Notes (Signed)
Pt presents with retained splinter in right middle finger for past week, began swelling and is painful

## 2021-03-24 ENCOUNTER — Other Ambulatory Visit (HOSPITAL_COMMUNITY): Payer: Self-pay | Admitting: Internal Medicine

## 2021-03-24 DIAGNOSIS — Z1231 Encounter for screening mammogram for malignant neoplasm of breast: Secondary | ICD-10-CM

## 2021-04-16 DIAGNOSIS — N1831 Chronic kidney disease, stage 3a: Secondary | ICD-10-CM | POA: Diagnosis not present

## 2021-04-16 DIAGNOSIS — R809 Proteinuria, unspecified: Secondary | ICD-10-CM | POA: Diagnosis not present

## 2021-04-16 DIAGNOSIS — I129 Hypertensive chronic kidney disease with stage 1 through stage 4 chronic kidney disease, or unspecified chronic kidney disease: Secondary | ICD-10-CM | POA: Diagnosis not present

## 2021-04-18 DIAGNOSIS — Z20822 Contact with and (suspected) exposure to covid-19: Secondary | ICD-10-CM | POA: Diagnosis not present

## 2021-04-23 DIAGNOSIS — R718 Other abnormality of red blood cells: Secondary | ICD-10-CM | POA: Diagnosis not present

## 2021-04-23 DIAGNOSIS — N1831 Chronic kidney disease, stage 3a: Secondary | ICD-10-CM | POA: Diagnosis not present

## 2021-04-23 DIAGNOSIS — I129 Hypertensive chronic kidney disease with stage 1 through stage 4 chronic kidney disease, or unspecified chronic kidney disease: Secondary | ICD-10-CM | POA: Diagnosis not present

## 2021-04-23 DIAGNOSIS — R809 Proteinuria, unspecified: Secondary | ICD-10-CM | POA: Diagnosis not present

## 2021-05-08 ENCOUNTER — Ambulatory Visit (HOSPITAL_COMMUNITY)
Admission: RE | Admit: 2021-05-08 | Discharge: 2021-05-08 | Disposition: A | Discharge status: Home / Self Care | Payer: Medicare Other | Source: Ambulatory Visit | Attending: Internal Medicine | Admitting: Internal Medicine

## 2021-05-08 ENCOUNTER — Other Ambulatory Visit: Payer: Self-pay

## 2021-05-08 DIAGNOSIS — Z1231 Encounter for screening mammogram for malignant neoplasm of breast: Secondary | ICD-10-CM | POA: Insufficient documentation

## 2021-05-28 DIAGNOSIS — Z20822 Contact with and (suspected) exposure to covid-19: Secondary | ICD-10-CM | POA: Diagnosis not present

## 2021-06-02 ENCOUNTER — Other Ambulatory Visit: Payer: Self-pay | Admitting: Adult Health

## 2021-06-12 ENCOUNTER — Telehealth: Payer: Self-pay | Admitting: Adult Health

## 2021-06-12 DIAGNOSIS — E7800 Pure hypercholesterolemia, unspecified: Secondary | ICD-10-CM

## 2021-06-12 DIAGNOSIS — Z130 Encounter for screening for diseases of the blood and blood-forming organs and certain disorders involving the immune mechanism: Secondary | ICD-10-CM

## 2021-06-12 DIAGNOSIS — E039 Hypothyroidism, unspecified: Secondary | ICD-10-CM

## 2021-06-12 DIAGNOSIS — R7989 Other specified abnormal findings of blood chemistry: Secondary | ICD-10-CM

## 2021-06-12 NOTE — Addendum Note (Signed)
Addended by: Cyril Mourning A on: 06/12/2021 05:47 PM ? ? Modules accepted: Orders ? ?

## 2021-06-12 NOTE — Telephone Encounter (Signed)
Patient is calling wanting to see if you will put orders in for lab work to see her thyroid levels; and she said you would have a long list to order lol ? ?

## 2021-06-12 NOTE — Telephone Encounter (Signed)
Check CMP,TSH and lipids  ?

## 2021-06-13 DIAGNOSIS — E039 Hypothyroidism, unspecified: Secondary | ICD-10-CM | POA: Diagnosis not present

## 2021-06-13 DIAGNOSIS — E7800 Pure hypercholesterolemia, unspecified: Secondary | ICD-10-CM | POA: Diagnosis not present

## 2021-06-13 DIAGNOSIS — R7989 Other specified abnormal findings of blood chemistry: Secondary | ICD-10-CM | POA: Diagnosis not present

## 2021-06-14 LAB — COMPREHENSIVE METABOLIC PANEL
ALT: 19 IU/L (ref 0–32)
AST: 22 IU/L (ref 0–40)
Albumin/Globulin Ratio: 1.6 (ref 1.2–2.2)
Albumin: 4.1 g/dL (ref 3.7–4.7)
Alkaline Phosphatase: 49 IU/L (ref 44–121)
BUN/Creatinine Ratio: 20 (ref 12–28)
BUN: 25 mg/dL (ref 8–27)
Bilirubin Total: 0.8 mg/dL (ref 0.0–1.2)
CO2: 24 mmol/L (ref 20–29)
Calcium: 9.4 mg/dL (ref 8.7–10.3)
Chloride: 103 mmol/L (ref 96–106)
Creatinine, Ser: 1.22 mg/dL — ABNORMAL HIGH (ref 0.57–1.00)
Globulin, Total: 2.5 g/dL (ref 1.5–4.5)
Glucose: 100 mg/dL — ABNORMAL HIGH (ref 70–99)
Potassium: 4.6 mmol/L (ref 3.5–5.2)
Sodium: 143 mmol/L (ref 134–144)
Total Protein: 6.6 g/dL (ref 6.0–8.5)
eGFR: 46 mL/min/{1.73_m2} — ABNORMAL LOW (ref >59)

## 2021-06-14 LAB — LIPID PANEL
Chol/HDL Ratio: 4.3 ratio (ref 0.0–4.4)
Cholesterol, Total: 197 mg/dL (ref 100–199)
HDL: 46 mg/dL (ref >39)
LDL Chol Calc (NIH): 123 mg/dL — ABNORMAL HIGH (ref 0–99)
Triglycerides: 160 mg/dL — ABNORMAL HIGH (ref 0–149)
VLDL Cholesterol Cal: 28 mg/dL (ref 5–40)

## 2021-06-14 LAB — TSH: TSH: 2.66 u[IU]/mL (ref 0.450–4.500)

## 2021-06-23 DIAGNOSIS — Z20822 Contact with and (suspected) exposure to covid-19: Secondary | ICD-10-CM | POA: Diagnosis not present

## 2021-07-23 DIAGNOSIS — R809 Proteinuria, unspecified: Secondary | ICD-10-CM | POA: Diagnosis not present

## 2021-07-23 DIAGNOSIS — N1831 Chronic kidney disease, stage 3a: Secondary | ICD-10-CM | POA: Diagnosis not present

## 2021-07-23 DIAGNOSIS — R718 Other abnormality of red blood cells: Secondary | ICD-10-CM | POA: Diagnosis not present

## 2021-07-23 DIAGNOSIS — I129 Hypertensive chronic kidney disease with stage 1 through stage 4 chronic kidney disease, or unspecified chronic kidney disease: Secondary | ICD-10-CM | POA: Diagnosis not present

## 2021-07-30 DIAGNOSIS — N1831 Chronic kidney disease, stage 3a: Secondary | ICD-10-CM | POA: Diagnosis not present

## 2021-07-30 DIAGNOSIS — I129 Hypertensive chronic kidney disease with stage 1 through stage 4 chronic kidney disease, or unspecified chronic kidney disease: Secondary | ICD-10-CM | POA: Diagnosis not present

## 2021-07-30 DIAGNOSIS — R809 Proteinuria, unspecified: Secondary | ICD-10-CM | POA: Diagnosis not present

## 2021-08-13 ENCOUNTER — Other Ambulatory Visit: Payer: Self-pay | Admitting: Adult Health

## 2021-10-30 ENCOUNTER — Other Ambulatory Visit: Payer: Self-pay | Admitting: *Deleted

## 2021-10-30 ENCOUNTER — Encounter: Payer: Self-pay | Admitting: *Deleted

## 2021-10-30 NOTE — Patient Instructions (Signed)
Visit Information  Thank you for taking time to visit with me today. Please don't hesitate to contact me if I can be of assistance to you before our next scheduled telephone appointment.  Following are the goals we discussed today:  Schedule AWV with PCP.   Please call the Suicide and Crisis Lifeline: 988 call the [place] National Suicide Prevention Lifeline: 206-121-8722 or TTY: 912-462-8279 TTY (718) 377-0902) to talk to a trained counselor call 1-800-273-TALK (toll free, 24 hour hotline) call the [facility]: [phone])582-3224 call 911 if you are experiencing a Mental Health or Behavioral Health Crisis or need someone to talk to.  Patient verbalizes understanding of instructions and care plan provided today and agrees to view in MyChart. Active MyChart status and patient understanding of how to access instructions and care plan via MyChart confirmed with patient.     The patient has been provided with contact information for the care management team and has been advised to call with any health related questions or concerns.   Kemper Durie, RN, MSN, [facility] Care Management Care Management Coordinator [phone]

## 2021-10-30 NOTE — Patient Outreach (Signed)
  Care Coordination   Initial Visit Note   10/30/2021 Name: KARRON GOENS MRN: [id number] DOB: 14-Apr-1945  QUINTA EIMER is a 76 y.o. year old female who sees Carylon Perches, MD for primary care. I spoke with  Lorelle Formosa Ingman by phone today.  What matters to the patients health and wellness today?  Report she is doing well, does not request any follow up on ongoing education. Has not seen PCP since last year, state she has been following up with GYN.  Educated on need for AWV, she verbalizes understanding.     Goals Addressed             This Visit's Progress    COMPLETED: Care Coordination Activities - No follow up needed       Care Coordination Interventions: Evaluation of current treatment plan related to Health maintanence and patient's adherence to plan as established by provider Advised patient to Consider seeing PCP yearly for AWV Reviewed scheduled/upcoming provider appointments including GYN and nephrology Discussed plans with patient for ongoing care management follow up and provided patient with direct contact information for care management team Assessed social determinant of health barriers         SDOH assessments and interventions completed:  Yes  SDOH Interventions Today    Flowsheet Row Most Recent Value  SDOH Interventions   Food Insecurity Interventions Intervention Not Indicated  Housing Interventions Intervention Not Indicated  Transportation Interventions Intervention Not Indicated        Care Coordination Interventions Activated:  Yes  Care Coordination Interventions:  Yes, provided   Follow up plan: No further intervention required.   Encounter Outcome:  Pt. Visit Completed   Kemper Durie, RN, MSN, [facility] Care Management Care Management Coordinator [phone]

## 2021-11-19 DIAGNOSIS — N1831 Chronic kidney disease, stage 3a: Secondary | ICD-10-CM | POA: Diagnosis not present

## 2021-11-19 DIAGNOSIS — I129 Hypertensive chronic kidney disease with stage 1 through stage 4 chronic kidney disease, or unspecified chronic kidney disease: Secondary | ICD-10-CM | POA: Diagnosis not present

## 2021-11-19 DIAGNOSIS — R809 Proteinuria, unspecified: Secondary | ICD-10-CM | POA: Diagnosis not present

## 2021-11-26 DIAGNOSIS — E870 Hyperosmolality and hypernatremia: Secondary | ICD-10-CM | POA: Diagnosis not present

## 2021-11-26 DIAGNOSIS — N1831 Chronic kidney disease, stage 3a: Secondary | ICD-10-CM | POA: Diagnosis not present

## 2021-11-26 DIAGNOSIS — R809 Proteinuria, unspecified: Secondary | ICD-10-CM | POA: Diagnosis not present

## 2021-11-26 DIAGNOSIS — I129 Hypertensive chronic kidney disease with stage 1 through stage 4 chronic kidney disease, or unspecified chronic kidney disease: Secondary | ICD-10-CM | POA: Diagnosis not present

## 2022-01-14 DIAGNOSIS — Z23 Encounter for immunization: Secondary | ICD-10-CM | POA: Diagnosis not present

## 2022-02-03 ENCOUNTER — Other Ambulatory Visit: Payer: Self-pay | Admitting: Adult Health

## 2022-03-16 ENCOUNTER — Telehealth: Payer: Self-pay | Admitting: Adult Health

## 2022-03-16 DIAGNOSIS — E039 Hypothyroidism, unspecified: Secondary | ICD-10-CM

## 2022-03-16 DIAGNOSIS — R7989 Other specified abnormal findings of blood chemistry: Secondary | ICD-10-CM

## 2022-03-16 DIAGNOSIS — E7800 Pure hypercholesterolemia, unspecified: Secondary | ICD-10-CM

## 2022-03-16 DIAGNOSIS — Z130 Encounter for screening for diseases of the blood and blood-forming organs and certain disorders involving the immune mechanism: Secondary | ICD-10-CM

## 2022-03-16 NOTE — Telephone Encounter (Signed)
Pt aware that orders in for labs, Check CMP,TSH and lipids

## 2022-03-16 NOTE — Telephone Encounter (Signed)
Pt is requesting bloodwork orders to be sent to the lab before her next appointment on 05/12/2022. Please advise

## 2022-03-19 DIAGNOSIS — R7989 Other specified abnormal findings of blood chemistry: Secondary | ICD-10-CM | POA: Diagnosis not present

## 2022-03-19 DIAGNOSIS — E039 Hypothyroidism, unspecified: Secondary | ICD-10-CM | POA: Diagnosis not present

## 2022-03-19 DIAGNOSIS — E7800 Pure hypercholesterolemia, unspecified: Secondary | ICD-10-CM | POA: Diagnosis not present

## 2022-03-20 LAB — COMPREHENSIVE METABOLIC PANEL
ALT: 20 IU/L (ref 0–32)
AST: 23 IU/L (ref 0–40)
Albumin/Globulin Ratio: 1.6 (ref 1.2–2.2)
Albumin: 4.3 g/dL (ref 3.8–4.8)
Alkaline Phosphatase: 48 IU/L (ref 44–121)
BUN/Creatinine Ratio: 20 (ref 12–28)
BUN: 23 mg/dL (ref 8–27)
Bilirubin Total: 0.7 mg/dL (ref 0.0–1.2)
CO2: 24 mmol/L (ref 20–29)
Calcium: 9.4 mg/dL (ref 8.7–10.3)
Chloride: 102 mmol/L (ref 96–106)
Creatinine, Ser: 1.17 mg/dL — ABNORMAL HIGH (ref 0.57–1.00)
Globulin, Total: 2.7 g/dL (ref 1.5–4.5)
Glucose: 100 mg/dL — ABNORMAL HIGH (ref 70–99)
Potassium: 4.3 mmol/L (ref 3.5–5.2)
Sodium: 139 mmol/L (ref 134–144)
Total Protein: 7 g/dL (ref 6.0–8.5)
eGFR: 48 mL/min/{1.73_m2} — ABNORMAL LOW (ref >59)

## 2022-03-20 LAB — LIPID PANEL
Chol/HDL Ratio: 3.5 ratio (ref 0.0–4.4)
Cholesterol, Total: 203 mg/dL — ABNORMAL HIGH (ref 100–199)
HDL: 58 mg/dL (ref >39)
LDL Chol Calc (NIH): 118 mg/dL — ABNORMAL HIGH (ref 0–99)
Triglycerides: 154 mg/dL — ABNORMAL HIGH (ref 0–149)
VLDL Cholesterol Cal: 27 mg/dL (ref 5–40)

## 2022-03-20 LAB — TSH: TSH: 2.17 u[IU]/mL (ref 0.450–4.500)

## 2022-04-27 ENCOUNTER — Other Ambulatory Visit (HOSPITAL_COMMUNITY): Payer: Self-pay | Admitting: Internal Medicine

## 2022-04-27 DIAGNOSIS — Z1231 Encounter for screening mammogram for malignant neoplasm of breast: Secondary | ICD-10-CM

## 2022-05-11 ENCOUNTER — Encounter (HOSPITAL_COMMUNITY): Payer: Self-pay

## 2022-05-11 ENCOUNTER — Ambulatory Visit (HOSPITAL_COMMUNITY)
Admission: RE | Admit: 2022-05-11 | Discharge: 2022-05-11 | Disposition: A | Discharge status: Home / Self Care | Payer: PPO | Source: Ambulatory Visit | Attending: Internal Medicine | Admitting: Internal Medicine

## 2022-05-11 DIAGNOSIS — Z1231 Encounter for screening mammogram for malignant neoplasm of breast: Secondary | ICD-10-CM | POA: Diagnosis not present

## 2022-05-12 ENCOUNTER — Ambulatory Visit: Payer: PPO | Admitting: Adult Health

## 2022-05-13 DIAGNOSIS — E785 Hyperlipidemia, unspecified: Secondary | ICD-10-CM | POA: Diagnosis not present

## 2022-05-13 DIAGNOSIS — I10 Essential (primary) hypertension: Secondary | ICD-10-CM | POA: Diagnosis not present

## 2022-05-13 DIAGNOSIS — R32 Unspecified urinary incontinence: Secondary | ICD-10-CM | POA: Diagnosis not present

## 2022-05-13 DIAGNOSIS — J309 Allergic rhinitis, unspecified: Secondary | ICD-10-CM | POA: Diagnosis not present

## 2022-05-13 DIAGNOSIS — E669 Obesity, unspecified: Secondary | ICD-10-CM | POA: Diagnosis not present

## 2022-05-13 DIAGNOSIS — E039 Hypothyroidism, unspecified: Secondary | ICD-10-CM | POA: Diagnosis not present

## 2022-05-13 DIAGNOSIS — M810 Age-related osteoporosis without current pathological fracture: Secondary | ICD-10-CM | POA: Diagnosis not present

## 2022-06-11 DIAGNOSIS — R809 Proteinuria, unspecified: Secondary | ICD-10-CM | POA: Diagnosis not present

## 2022-06-11 DIAGNOSIS — I129 Hypertensive chronic kidney disease with stage 1 through stage 4 chronic kidney disease, or unspecified chronic kidney disease: Secondary | ICD-10-CM | POA: Diagnosis not present

## 2022-06-11 DIAGNOSIS — E870 Hyperosmolality and hypernatremia: Secondary | ICD-10-CM | POA: Diagnosis not present

## 2022-06-11 DIAGNOSIS — N1831 Chronic kidney disease, stage 3a: Secondary | ICD-10-CM | POA: Diagnosis not present

## 2022-07-01 ENCOUNTER — Other Ambulatory Visit: Payer: Self-pay | Admitting: Adult Health

## 2022-07-03 ENCOUNTER — Ambulatory Visit (INDEPENDENT_AMBULATORY_CARE_PROVIDER_SITE_OTHER): Payer: PPO | Admitting: Adult Health

## 2022-07-03 ENCOUNTER — Encounter: Payer: Self-pay | Admitting: Adult Health

## 2022-07-03 VITALS — BP 141/82 | HR 73 | Ht 63.0 in | Wt 175.0 lb

## 2022-07-03 DIAGNOSIS — Z1211 Encounter for screening for malignant neoplasm of colon: Secondary | ICD-10-CM | POA: Diagnosis not present

## 2022-07-03 DIAGNOSIS — M810 Age-related osteoporosis without current pathological fracture: Secondary | ICD-10-CM | POA: Diagnosis not present

## 2022-07-03 DIAGNOSIS — E039 Hypothyroidism, unspecified: Secondary | ICD-10-CM | POA: Diagnosis not present

## 2022-07-03 DIAGNOSIS — Z01419 Encounter for gynecological examination (general) (routine) without abnormal findings: Secondary | ICD-10-CM

## 2022-07-03 DIAGNOSIS — Z90710 Acquired absence of both cervix and uterus: Secondary | ICD-10-CM

## 2022-07-03 DIAGNOSIS — N1831 Chronic kidney disease, stage 3a: Secondary | ICD-10-CM | POA: Diagnosis not present

## 2022-07-03 LAB — HEMOCCULT GUIAC POC 1CARD (OFFICE): Fecal Occult Blood, POC: NEGATIVE

## 2022-07-03 MED ORDER — LEVOTHYROXINE SODIUM 50 MCG PO TABS: 50.0000 ug | ORAL_TABLET | Freq: Every day | ORAL | 3 refills | Status: DC

## 2022-07-03 NOTE — Progress Notes (Signed)
Patient ID: Brooke Leach, female   DOB: [birth year], 77 y.o.   MRN: [id number] History of Present Illness: Brooke Leach is a 77 year old white female, widowed, sp hysterectomy in for a well woman gyn exam. She retired in 2022, and is living alone and doing well.  PCP is Dr Ouida Sills   Current Medications, Allergies, Past Medical History, Past Surgical History, Family History and Social History were reviewed in [employer] electronic medical record.     Review of Systems: Patient denies any headaches, hearing loss, fatigue, blurred vision, shortness of breath, chest pain, abdominal pain, problems with bowel movements, urination, or intercourse(not active). No joint pain or mood swings.  Denies any bleeding   Physical Exam:BP (!) 141/82 (BP Location: Right Arm, Patient Position: Sitting, Cuff Size: Normal)   Pulse 73   Ht 5\' 3"  (1.6 m)   Wt 175 lb (79.4 kg)   BMI 31.00 kg/m   General:  Well developed, well nourished, no acute distress Skin:  Warm and dry, has multiple moles and AKs Neck:  Midline trachea, normal thyroid, good ROM, no lymphadenopathy,no carotid bruits heard Lungs; Clear to auscultation bilaterally Breast:  No dominant palpable mass, retraction, or nipple discharge Cardiovascular: Regular rate and rhythm Abdomen:  Soft, non tender, no hepatosplenomegaly Pelvic:  External genitalia is normal in appearance, no lesions.  The vagina is pale. Urethra has no lesions or masses. The cervix and uterus are absent.  No adnexal masses or tenderness noted.Bladder is non tender, no masses felt. Rectal: Good sphincter tone, no polyps, or hemorrhoids felt.  Hemoccult negative. Extremities/musculoskeletal:  No swelling or varicosities noted, no clubbing or cyanosis Psych:  No mood changes, alert and cooperative,seems happy AA is 0 Fall  risk is low    07/03/2022   12:06 PM 05/09/2020   11:51 AM 05/05/2018    9:19 AM  Depression screen PHQ 2/9  Decreased Interest 0 0 0  Down, Depressed,  Hopeless 0 0 0  PHQ - 2 Score 0 0 0  Altered sleeping 0 0   Tired, decreased energy 0 0   Change in appetite 0 0   Feeling bad or failure about yourself  0 0   Trouble concentrating 0 0   Moving slowly or fidgety/restless 0 0   Suicidal thoughts 0 0   PHQ-9 Score 0 0        07/03/2022   12:06 PM 05/09/2020   11:51 AM  GAD 7 : Generalized Anxiety Score  Nervous, Anxious, on Edge 0 0  Control/stop worrying 0 0  Worry too much - different things 0 0  Trouble relaxing 0 0  Restless 0 0  Easily annoyed or irritable 0 0  Afraid - awful might happen 0 0  Total GAD 7 Score 0 0      Upstream - 07/03/22 1209       Pregnancy Intention Screening   Does the patient want to become pregnant in the next year? N/A    Does the patient's partner want to become pregnant in the next year? N/A    Would the patient like to discuss contraceptive options today? N/A      Contraception Wrap Up   Current Method Abstinence;Female Sterilization   hyst   End Method Abstinence;Female Sterilization   hyst   Contraception Counseling Provided No            Examination chaperoned by Malachy Mood LPN   Impression and plan: 1. Encounter for well woman exam with routine  gynecological exam Physical in 2 years Mammogram was negative 05/11/22 Has appt with Dr Ouida Sills soon   2. S/P hysterectomy  3. Hypothyroidism, unspecified type Continue synthroid 50 mcg Meds ordered this encounter  Medications   levothyroxine (SYNTHROID) 50 MCG tablet    Sig: Take 1 tablet (50 mcg total) by mouth daily.    Dispense:  90 tablet    Refill:  3    Order Specific Question:   Supervising Provider    Answer:   Despina Hidden, LUTHER H [[id number]]     4. Age-related osteoporosis without current pathological fracture On boniva, has refills   5. Encounter for screening fecal occult blood testing Hemoccult was negative  - POCT occult blood stool  6. Stage 3a chronic kidney disease (HCC) Sees Dr Wolfgang Phoenix, has appt in next few weeks

## 2022-07-13 DIAGNOSIS — R809 Proteinuria, unspecified: Secondary | ICD-10-CM | POA: Diagnosis not present

## 2022-07-13 DIAGNOSIS — I129 Hypertensive chronic kidney disease with stage 1 through stage 4 chronic kidney disease, or unspecified chronic kidney disease: Secondary | ICD-10-CM | POA: Diagnosis not present

## 2022-07-13 DIAGNOSIS — N1831 Chronic kidney disease, stage 3a: Secondary | ICD-10-CM | POA: Diagnosis not present

## 2022-07-14 DIAGNOSIS — Z0000 Encounter for general adult medical examination without abnormal findings: Secondary | ICD-10-CM | POA: Diagnosis not present

## 2022-07-14 DIAGNOSIS — Z23 Encounter for immunization: Secondary | ICD-10-CM | POA: Diagnosis not present

## 2022-07-21 DIAGNOSIS — Z1212 Encounter for screening for malignant neoplasm of rectum: Secondary | ICD-10-CM | POA: Diagnosis not present

## 2022-07-21 DIAGNOSIS — Z1211 Encounter for screening for malignant neoplasm of colon: Secondary | ICD-10-CM | POA: Diagnosis not present

## 2022-12-21 IMAGING — MG MM DIGITAL SCREENING BILAT W/ TOMO AND CAD
8 series · 9 of 24 positions shown · non-contrast
Comparison: Previous exam(s).

CLINICAL DATA: Screening.

EXAM:
DIGITAL SCREENING BILATERAL MAMMOGRAM WITH TOMOSYNTHESIS AND CAD
TECHNIQUE: Bilateral screening digital craniocaudal and mediolateral oblique
mammograms were obtained. Bilateral screening digital breast
tomosynthesis was performed. The images were evaluated with
computer-aided detection.

[L CC synth-2D]
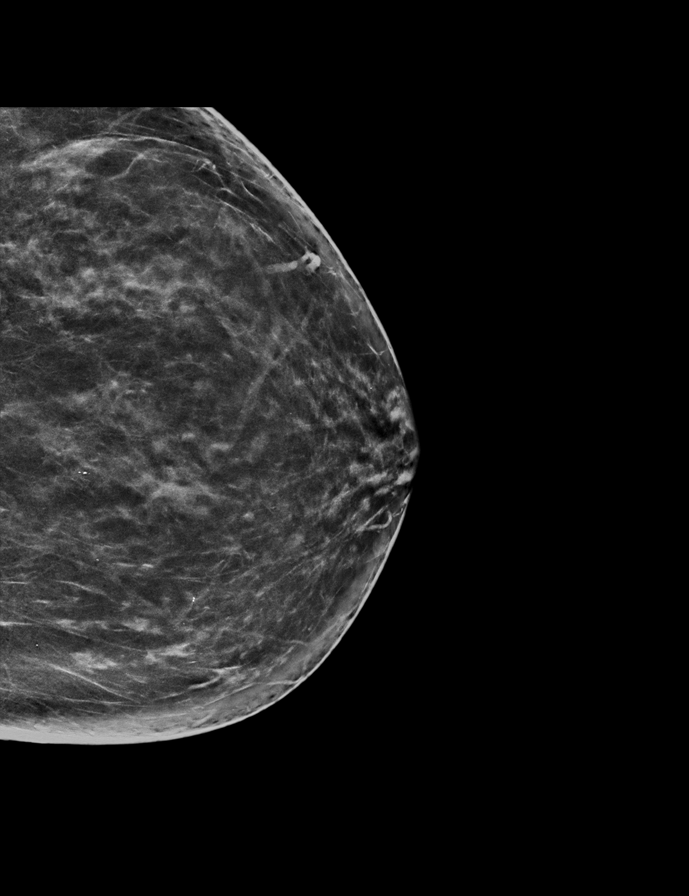

[R CC synth-2D]
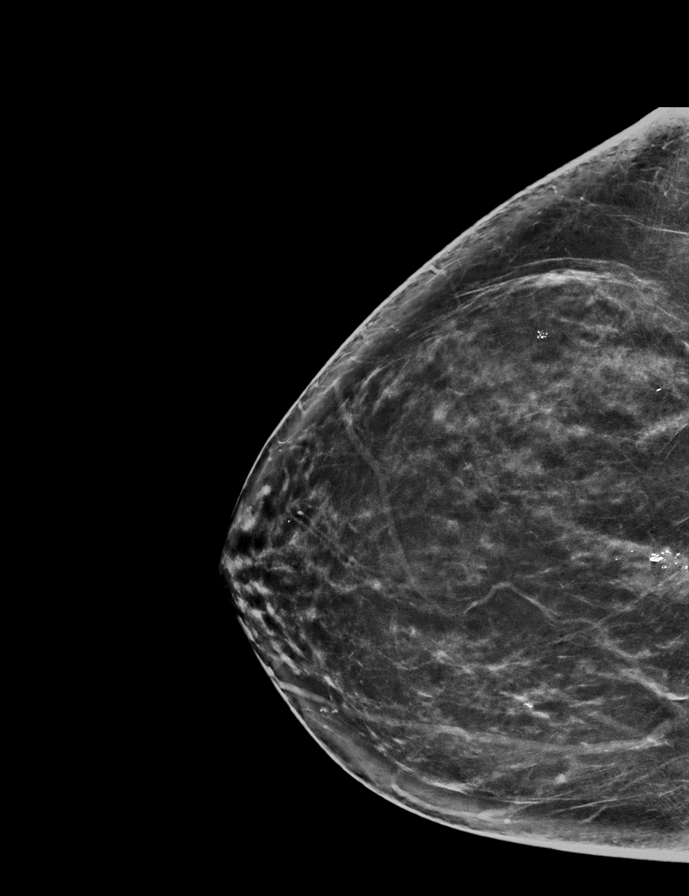

[L MLO synth-2D]
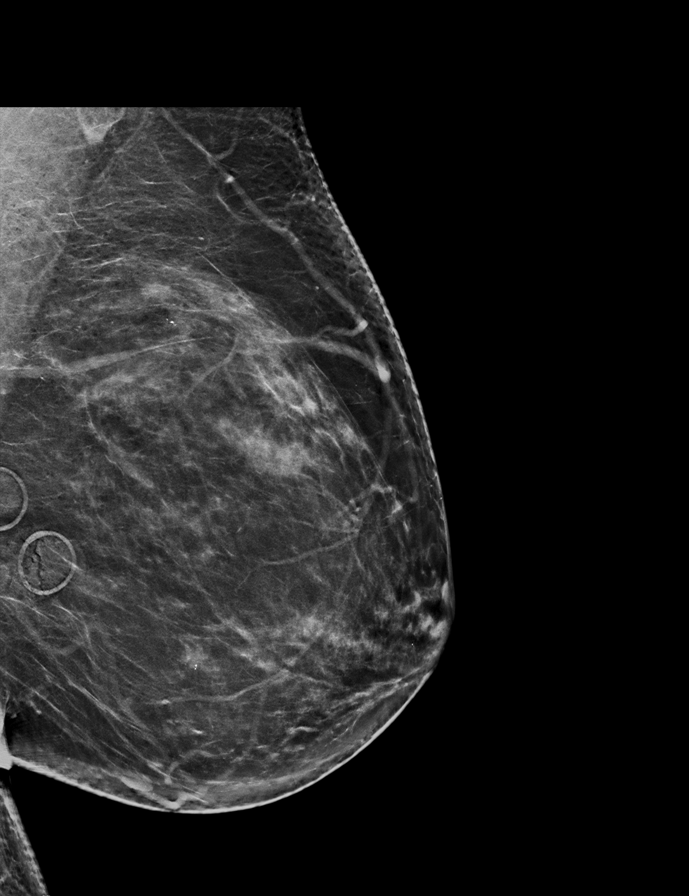

[R MLO synth-2D]
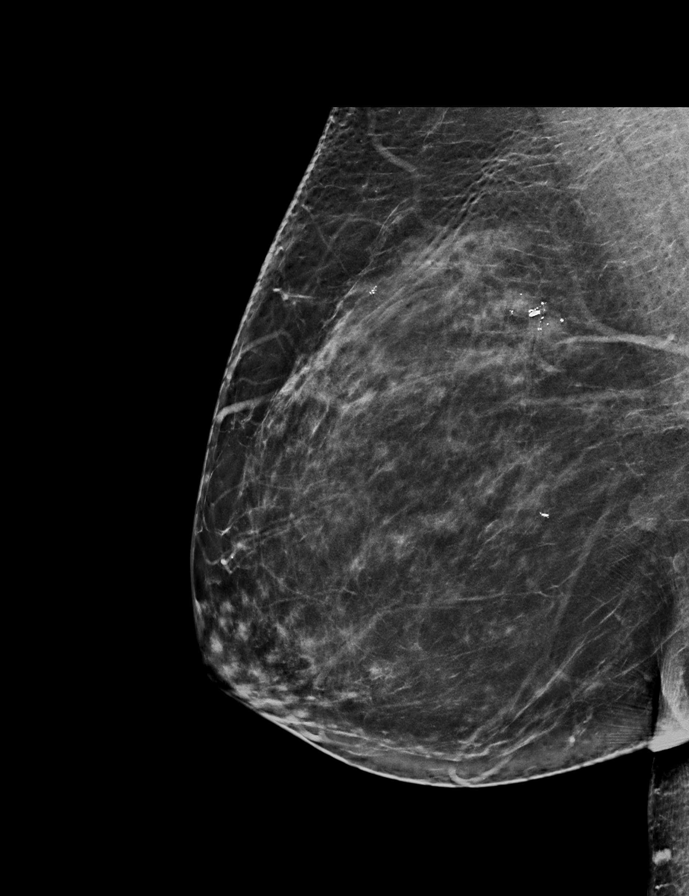

[L MLO tomo · 2 of 75 frames shown]
[frame 25/75]
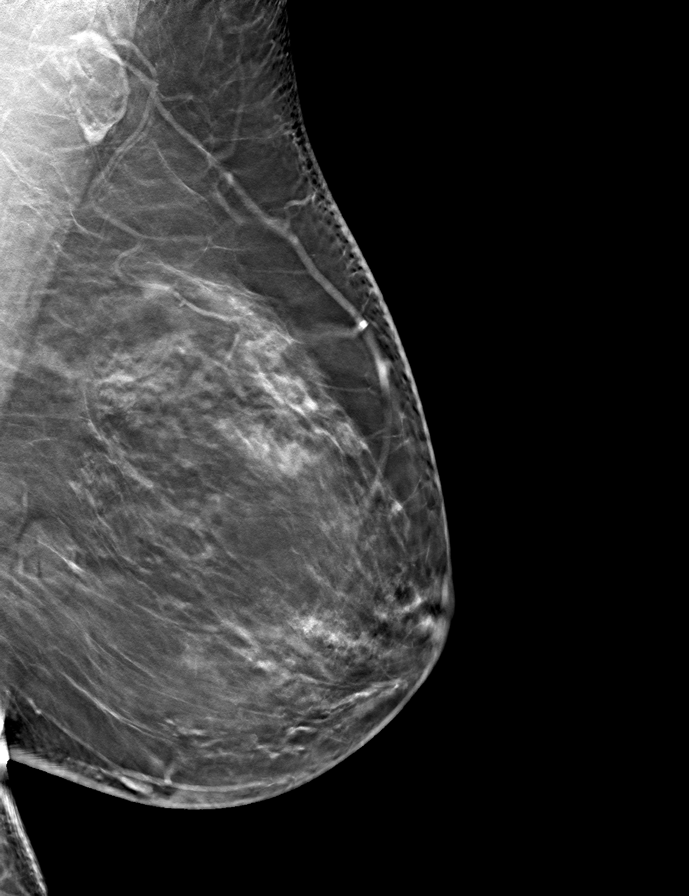
[frame 38/75]
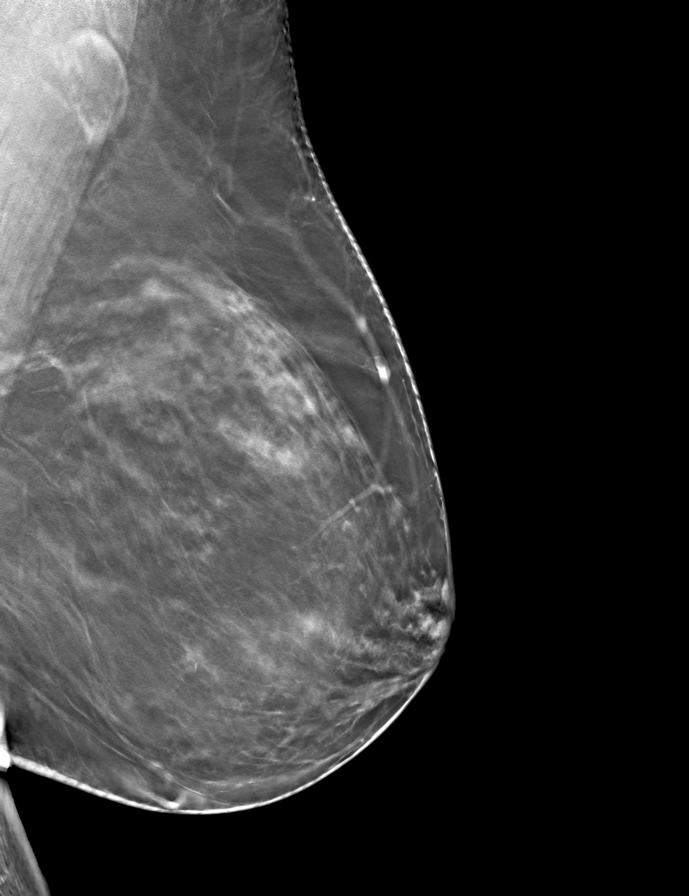

[R MLO tomo · tomo slice 37/73.0]
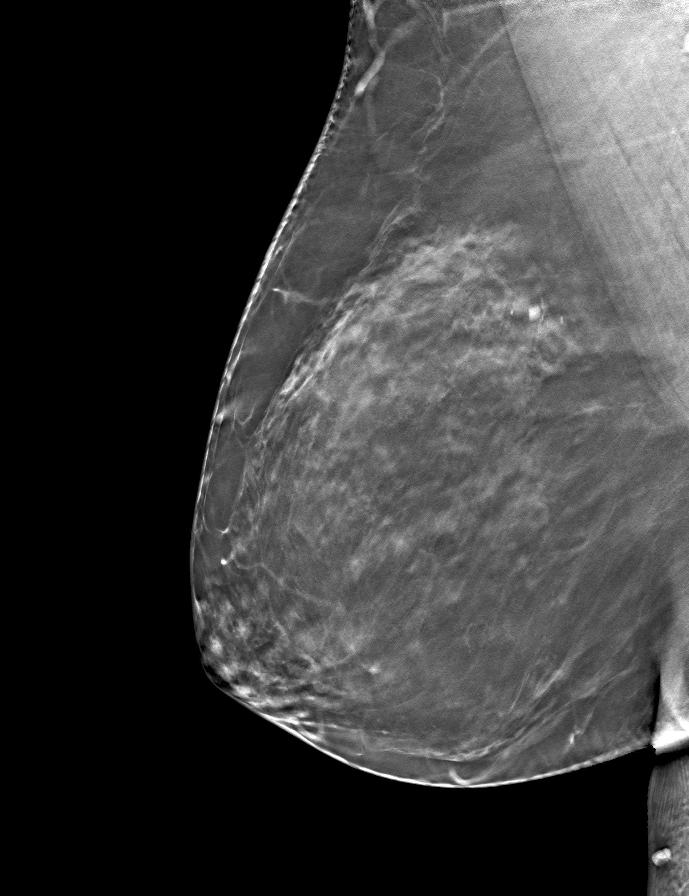

[L CC tomo · tomo slice 35/68.0]
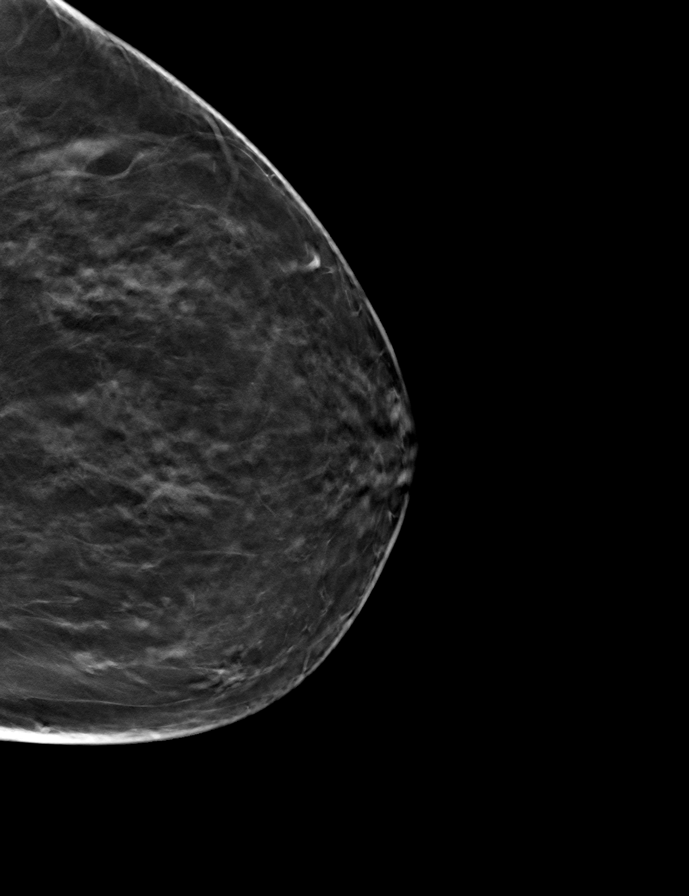

[R CC tomo · tomo slice 37/74.0]
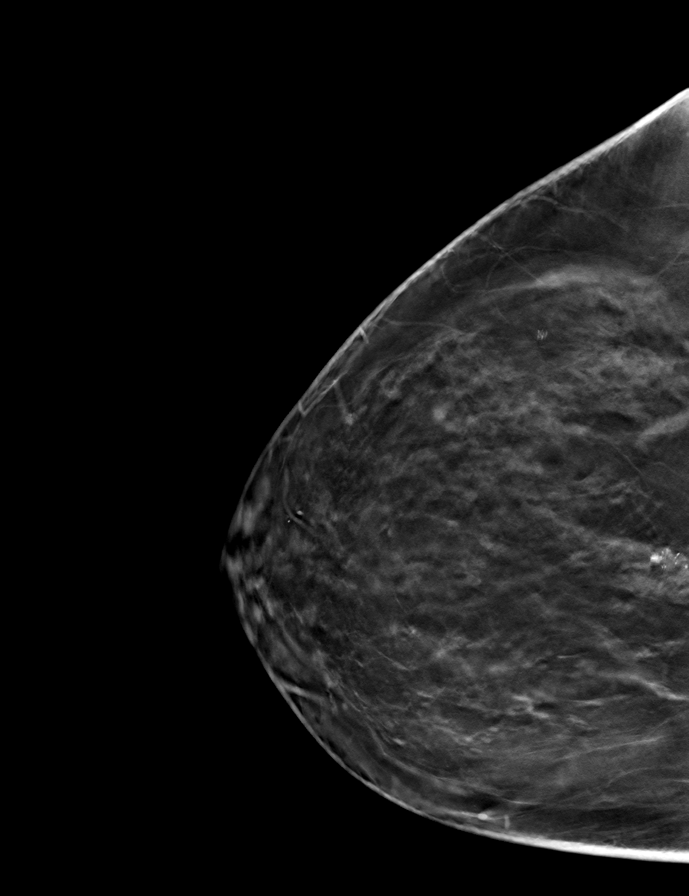

[9 of 24 positions shown; findings below may reference images not displayed]

ACR Breast Density Category b: There are scattered areas of
fibroglandular density.
FINDINGS: There are no findings suspicious for malignancy.
IMPRESSION: No mammographic evidence of malignancy. A result letter of this
screening mammogram will be mailed directly to the patient.

RECOMMENDATION:
Screening mammogram in one year. (Code:51-O-LD2)

BI-RADS CATEGORY  1: Negative.

## 2023-01-11 DIAGNOSIS — Z23 Encounter for immunization: Secondary | ICD-10-CM | POA: Diagnosis not present

## 2023-01-12 DIAGNOSIS — E559 Vitamin D deficiency, unspecified: Secondary | ICD-10-CM | POA: Diagnosis not present

## 2023-01-12 DIAGNOSIS — N189 Chronic kidney disease, unspecified: Secondary | ICD-10-CM | POA: Diagnosis not present

## 2023-01-12 DIAGNOSIS — N1831 Chronic kidney disease, stage 3a: Secondary | ICD-10-CM | POA: Diagnosis not present

## 2023-01-12 DIAGNOSIS — I129 Hypertensive chronic kidney disease with stage 1 through stage 4 chronic kidney disease, or unspecified chronic kidney disease: Secondary | ICD-10-CM | POA: Diagnosis not present

## 2023-01-12 DIAGNOSIS — R809 Proteinuria, unspecified: Secondary | ICD-10-CM | POA: Diagnosis not present

## 2023-01-20 DIAGNOSIS — N1831 Chronic kidney disease, stage 3a: Secondary | ICD-10-CM | POA: Diagnosis not present

## 2023-01-20 DIAGNOSIS — I129 Hypertensive chronic kidney disease with stage 1 through stage 4 chronic kidney disease, or unspecified chronic kidney disease: Secondary | ICD-10-CM | POA: Diagnosis not present

## 2023-01-20 DIAGNOSIS — R809 Proteinuria, unspecified: Secondary | ICD-10-CM | POA: Diagnosis not present

## 2023-03-31 ENCOUNTER — Other Ambulatory Visit (HOSPITAL_COMMUNITY): Payer: Self-pay | Admitting: Adult Health

## 2023-03-31 DIAGNOSIS — Z1231 Encounter for screening mammogram for malignant neoplasm of breast: Secondary | ICD-10-CM

## 2023-05-13 ENCOUNTER — Encounter (HOSPITAL_COMMUNITY): Payer: Self-pay

## 2023-05-13 ENCOUNTER — Ambulatory Visit (HOSPITAL_COMMUNITY)
Admission: RE | Admit: 2023-05-13 | Discharge: 2023-05-13 | Disposition: A | Discharge status: Home / Self Care | Payer: PPO | Source: Ambulatory Visit | Attending: Adult Health | Admitting: Adult Health

## 2023-05-13 DIAGNOSIS — Z1231 Encounter for screening mammogram for malignant neoplasm of breast: Secondary | ICD-10-CM | POA: Insufficient documentation

## 2023-05-25 ENCOUNTER — Other Ambulatory Visit: Payer: Self-pay | Admitting: Adult Health

## 2023-05-25 DIAGNOSIS — E039 Hypothyroidism, unspecified: Secondary | ICD-10-CM

## 2023-05-25 DIAGNOSIS — R7989 Other specified abnormal findings of blood chemistry: Secondary | ICD-10-CM

## 2023-05-25 DIAGNOSIS — E7800 Pure hypercholesterolemia, unspecified: Secondary | ICD-10-CM

## 2023-05-25 DIAGNOSIS — N1831 Chronic kidney disease, stage 3a: Secondary | ICD-10-CM

## 2023-05-25 NOTE — Progress Notes (Signed)
 Ck CMP,lipids and TSH free T4

## 2023-06-22 DIAGNOSIS — R7989 Other specified abnormal findings of blood chemistry: Secondary | ICD-10-CM | POA: Diagnosis not present

## 2023-06-22 DIAGNOSIS — E7800 Pure hypercholesterolemia, unspecified: Secondary | ICD-10-CM | POA: Diagnosis not present

## 2023-06-22 DIAGNOSIS — N1831 Chronic kidney disease, stage 3a: Secondary | ICD-10-CM | POA: Diagnosis not present

## 2023-06-22 DIAGNOSIS — E039 Hypothyroidism, unspecified: Secondary | ICD-10-CM | POA: Diagnosis not present

## 2023-06-23 LAB — LIPID PANEL
Chol/HDL Ratio: 3.2 ratio (ref 0.0–4.4)
Cholesterol, Total: 181 mg/dL (ref 100–199)
HDL: 56 mg/dL (ref >39)
LDL Chol Calc (NIH): 100 mg/dL — ABNORMAL HIGH (ref 0–99)
Triglycerides: 142 mg/dL (ref 0–149)
VLDL Cholesterol Cal: 25 mg/dL (ref 5–40)

## 2023-06-23 LAB — COMPREHENSIVE METABOLIC PANEL WITH GFR
ALT: 23 IU/L (ref 0–32)
AST: 24 IU/L (ref 0–40)
Albumin: 4.1 g/dL (ref 3.8–4.8)
Alkaline Phosphatase: 43 IU/L — ABNORMAL LOW (ref 44–121)
BUN/Creatinine Ratio: 18 (ref 12–28)
BUN: 21 mg/dL (ref 8–27)
Bilirubin Total: 0.5 mg/dL (ref 0.0–1.2)
CO2: 22 mmol/L (ref 20–29)
Calcium: 9.2 mg/dL (ref 8.7–10.3)
Chloride: 106 mmol/L (ref 96–106)
Creatinine, Ser: 1.15 mg/dL — ABNORMAL HIGH (ref 0.57–1.00)
Globulin, Total: 2.8 g/dL (ref 1.5–4.5)
Glucose: 95 mg/dL (ref 70–99)
Potassium: 4.8 mmol/L (ref 3.5–5.2)
Sodium: 142 mmol/L (ref 134–144)
Total Protein: 6.9 g/dL (ref 6.0–8.5)
eGFR: 49 mL/min/{1.73_m2} — ABNORMAL LOW (ref >59)

## 2023-06-23 LAB — TSH+FREE T4
Free T4: 1.18 ng/dL (ref 0.82–1.77)
TSH: 3.69 u[IU]/mL (ref 0.450–4.500)

## 2023-07-02 ENCOUNTER — Other Ambulatory Visit: Payer: Self-pay | Admitting: Adult Health

## 2023-07-06 ENCOUNTER — Encounter: Payer: Self-pay | Admitting: Adult Health

## 2023-07-06 ENCOUNTER — Ambulatory Visit: Admitting: Adult Health

## 2023-07-06 VITALS — BP 147/77 | HR 91 | Ht 61.5 in | Wt 176.0 lb

## 2023-07-06 DIAGNOSIS — I10 Essential (primary) hypertension: Secondary | ICD-10-CM | POA: Diagnosis not present

## 2023-07-06 DIAGNOSIS — E039 Hypothyroidism, unspecified: Secondary | ICD-10-CM

## 2023-07-06 DIAGNOSIS — N1831 Chronic kidney disease, stage 3a: Secondary | ICD-10-CM

## 2023-07-06 DIAGNOSIS — Z01419 Encounter for gynecological examination (general) (routine) without abnormal findings: Secondary | ICD-10-CM | POA: Diagnosis not present

## 2023-07-06 DIAGNOSIS — M5441 Lumbago with sciatica, right side: Secondary | ICD-10-CM | POA: Insufficient documentation

## 2023-07-06 DIAGNOSIS — Z90710 Acquired absence of both cervix and uterus: Secondary | ICD-10-CM | POA: Diagnosis not present

## 2023-07-06 DIAGNOSIS — Z1211 Encounter for screening for malignant neoplasm of colon: Secondary | ICD-10-CM | POA: Diagnosis not present

## 2023-07-06 DIAGNOSIS — M810 Age-related osteoporosis without current pathological fracture: Secondary | ICD-10-CM | POA: Diagnosis not present

## 2023-07-06 DIAGNOSIS — Z1331 Encounter for screening for depression: Secondary | ICD-10-CM | POA: Diagnosis not present

## 2023-07-06 LAB — HEMOCCULT GUIAC POC 1CARD (OFFICE): Fecal Occult Blood, POC: NEGATIVE

## 2023-07-06 NOTE — Progress Notes (Signed)
 Patient ID: Brooke Leach, female   DOB: 1945/09/01, 78 y.o.   MRN: [id number] History of Present Illness: Brooke Leach is a 78 year old white female, widowed, sp hysterectomy in for a well woman gyn exam. She is complaining of low back pain with pain radiating down right hip into calf. Ice helps, but heat makes it worse  PCP is Dr Hildy Lowers    Current Medications, Allergies, Past Medical History, Past Surgical History, Family History and Social History were reviewed in [name] record.     Review of Systems: Patient denies any headaches, hearing loss, fatigue, blurred vision, shortness of breath, chest pain, abdominal pain, problems with bowel movements, urination, or intercourse(not active). No joint pain or mood swings.  See HPI for positives   Physical Exam:BP (!) 147/77 (BP Location: Right Arm, Patient Position: Sitting, Cuff Size: Normal)   Pulse 91   Ht 5' 1.5" (1.562 m)   Wt 176 lb (79.8 kg)   BMI 32.72 kg/m   General:  Well developed, well nourished, no acute distress Skin:  Warm and dry, has multiple AK and moles on back and chest Neck:  Midline trachea, normal thyroid , good ROM, no lymphadenopathy,no carotid bruits heard  Lungs; Clear to auscultation bilaterally Breast:  No dominant palpable mass, retraction, or nipple discharge Cardiovascular: Regular rate and rhythm Abdomen:  Soft, non tender, no hepatosplenomegaly Pelvic:  External genitalia is normal in appearance, no lesions.  The vagina is pale. Urethra has no lesions or masses. The cervix and uterus are absent.  No adnexal masses or tenderness noted.Bladder is non tender, no masses felt. Rectal: Good sphincter tone, no polyps, or hemorrhoids felt.  Hemoccult negative. Extremities/musculoskeletal:  No swelling, + varicosities noted, no clubbing or cyanosis, no pain when leg raised slightly  Psych:  No mood changes, alert and cooperative,seems happy AA is 0 Fall risk is low    07/06/2023   11:34 AM  07/03/2022   12:06 PM 05/09/2020   11:51 AM  Depression screen PHQ 2/9  Decreased Interest 0 0 0  Down, Depressed, Hopeless 0 0 0  PHQ - 2 Score 0 0 0  Altered sleeping 0 0 0  Tired, decreased energy 0 0 0  Change in appetite 0 0 0  Feeling bad or failure about yourself  0 0 0  Trouble concentrating 0 0 0  Moving slowly or fidgety/restless 0 0 0  Suicidal thoughts 0 0 0  PHQ-9 Score 0 0 0       07/06/2023   11:34 AM 07/03/2022   12:06 PM 05/09/2020   11:51 AM  GAD 7 : Generalized Anxiety Score  Nervous, Anxious, on Edge 0 0 0  Control/stop worrying 0 0 0  Worry too much - different things 0 0 0  Trouble relaxing 0 0 0  Restless 0 0 0  Easily annoyed or irritable 0 0 0  Afraid - awful might happen 0 0 0  Total GAD 7 Score 0 0 0      Upstream - 07/06/23 1132       Pregnancy Intention Screening   Does the patient want to become pregnant in the next year? N/A    Does the patient's partner want to become pregnant in the next year? N/A    Would the patient like to discuss contraceptive options today? N/A      Contraception Wrap Up   Current Method Female Sterilization   hyst   End Method Female Sterilization   hyst  Contraception Counseling Provided No             Examination chaperoned by Alphonso Aschoff LPN  Impression and plan: 1. Encounter for well woman exam with routine gynecological exam (Primary) Physical in 1 year Mammogram was negative 05/13/23  2. S/P hysterectomy  3. Encounter for screening fecal occult blood testing Hemoccult was negative  - POCT occult blood stool  4. Hypothyroidism, unspecified type On synthroid  50 mcg   5. Age-related osteoporosis without current pathological fracture Will get DEXA 07/09/23 at 10:30 am at [facility] On boniva  150 mg once a month - DG Bone Density; Future  6. Stage 3a chronic kidney disease (HCC) Sees Dr Carrolyn Clan  7. Hypertension, unspecified type Om meds and sees Dr Carrolyn Clan   8. Acute right-sided low back pain with  right-sided sciatica Has pain low back down right hip into calf, ice helps heat does not Will refer to ortho for evaluation  - Ambulatory referral to Orthopedic Surgery

## 2023-07-09 ENCOUNTER — Ambulatory Visit (HOSPITAL_COMMUNITY)
Admission: RE | Admit: 2023-07-09 | Discharge: 2023-07-09 | Disposition: A | Discharge status: Home / Self Care | Source: Ambulatory Visit | Attending: Adult Health | Admitting: Adult Health

## 2023-07-09 DIAGNOSIS — M810 Age-related osteoporosis without current pathological fracture: Secondary | ICD-10-CM | POA: Insufficient documentation

## 2023-07-09 DIAGNOSIS — M8589 Other specified disorders of bone density and structure, multiple sites: Secondary | ICD-10-CM | POA: Diagnosis not present

## 2023-07-09 DIAGNOSIS — Z780 Asymptomatic menopausal state: Secondary | ICD-10-CM | POA: Diagnosis not present

## 2023-07-12 DIAGNOSIS — E211 Secondary hyperparathyroidism, not elsewhere classified: Secondary | ICD-10-CM | POA: Diagnosis not present

## 2023-07-12 DIAGNOSIS — R809 Proteinuria, unspecified: Secondary | ICD-10-CM | POA: Diagnosis not present

## 2023-07-12 DIAGNOSIS — N189 Chronic kidney disease, unspecified: Secondary | ICD-10-CM | POA: Diagnosis not present

## 2023-07-12 DIAGNOSIS — D631 Anemia in chronic kidney disease: Secondary | ICD-10-CM | POA: Diagnosis not present

## 2023-07-19 ENCOUNTER — Ambulatory Visit: Admitting: Orthopedic Surgery

## 2023-07-19 ENCOUNTER — Other Ambulatory Visit: Payer: Self-pay

## 2023-07-19 VITALS — BP 116/80 | HR 105 | Ht 62.0 in | Wt 178.0 lb

## 2023-07-19 DIAGNOSIS — M5441 Lumbago with sciatica, right side: Secondary | ICD-10-CM

## 2023-07-19 DIAGNOSIS — I129 Hypertensive chronic kidney disease with stage 1 through stage 4 chronic kidney disease, or unspecified chronic kidney disease: Secondary | ICD-10-CM | POA: Diagnosis not present

## 2023-07-19 DIAGNOSIS — R809 Proteinuria, unspecified: Secondary | ICD-10-CM | POA: Diagnosis not present

## 2023-07-19 DIAGNOSIS — N1831 Chronic kidney disease, stage 3a: Secondary | ICD-10-CM | POA: Diagnosis not present

## 2023-07-19 MED ORDER — PREDNISONE 10 MG (48) PO TBPK
ORAL_TABLET | Freq: Every day | ORAL | 0 refills | Status: DC
Start: 2023-07-19 — End: 2023-08-09

## 2023-07-19 NOTE — Progress Notes (Signed)
 Patient ID: JUNO RIENTS, female   DOB: 09-12-1945, 78 y.o.   MRN: [id number]  Office Visit Note   Patient: Brooke Leach           Date of Birth: 1945-10-01           MRN: [id number] Visit Date: 07/19/2023 Requested by: Javan Messing, NP [address] [place],  [place] [address] PCP: Artemisa Bile, MD  Assessment & Plan:  78 year old female with less than 6 weeks of lower back pain with radiation into the right leg no history of cancer no trauma cannot take Advil due to ongoing issues with her kidneys mild case of sciatica  Images personally read and my interpretation :  no images   Visit Diagnoses:  1. Acute right-sided low back pain with right-sided sciatica     Plan: dose pack f/u 2 weeks  Follow-Up Instructions: Return in about 2 weeks (around 08/02/2023) for FOLLOW UP sciatica .   Orders:  Meds ordered this encounter  Medications   predniSONE (STERAPRED UNI-PAK 48 TAB) 10 MG (48) TBPK tablet    Sig: Take by mouth daily. 12 days ds 10 mg as directed    Dispense:  48 tablet    Refill:  0     Chief Complaint  Patient presents with   Back Pain    Pain radiates down right leg for several weeks extra strength tylenol and ice with some relief heat seemed to make it worse    HPI Brooke Leach is a 78 y.o. female.  Presents for evaluation of right sided leg pain with occasional numbness and tingling in the foot.  The symptoms started in the lower back radiated around into the posterior part of the thigh and knee.  There was an is some occasional symptoms in the foot.  Review of Systems  Constitutional:  Negative for fever.  Respiratory:  Negative for shortness of breath.   Cardiovascular:  Negative for chest pain.  Skin: Negative.   Neurological:  Negative for tingling and sensory change.    No history of cancer symptoms less than 6 weeks  No Known Allergies Current Outpatient Medications  Medication Instructions   ascorbic acid (VITAMIN C) 500 MG  tablet Take by mouth.   Cholecalciferol 25 MCG (1000 UT) tablet Take by mouth.   ibandronate  (BONIVA ) 150 MG tablet TAKE (1) TABLET ONCE A MONTH ONLY. TAKE WITH 6 TO 8 OZ OF WATER AND DO NOT LAY DOWN FOR 1 HOUR.   levothyroxine  (SYNTHROID ) 50 mcg, Oral, Daily   lisinopril (ZESTRIL) 2.5 MG tablet Daily   loratadine (CLARITIN) 10 MG tablet Take by mouth.   Multiple Vitamin (MULTIVITAMIN) tablet 1 tablet, Daily   predniSONE (STERAPRED UNI-PAK 48 TAB) 10 MG (48) TBPK tablet Oral, Daily, 12 days ds 10 mg as directed   Vitamin A 2400 MCG (8000 UT) CAPS Take by mouth.    Review of Systems Review of Systems  Constitutional:  Negative for fever.  Respiratory:  Negative for shortness of breath.   Cardiovascular:  Negative for chest pain.  Skin: Negative.   Neurological:  Negative for tingling and sensory change.    Past Medical History:  Diagnosis Date   Chronic kidney disease    stage 3 sees Dr Carrolyn Clan   Hemorrhoids 12/17/2014   Hypothyroid 12/11/2013   Osteoporosis 05/24/2020   Positive fecal occult blood test 12/17/2014   Thyroid  disease     Past Surgical History:  Procedure Laterality Date  ABDOMINAL HYSTERECTOMY      Family History  Problem Relation Age of Onset   Hypertension Mother    Other Father        pancreas issues   Thyroid  disease Sister    Thyroid  disease Sister    was reviewed  Social History Social History   Tobacco Use   Smoking status: Never   Smokeless tobacco: Never  Vaping Use   Vaping status: Never Used  Substance Use Topics   Alcohol use: No   Drug use: No    No Known Allergies  Current Outpatient Medications  Medication Sig Dispense Refill   ascorbic acid (VITAMIN C) 500 MG tablet Take by mouth.     Cholecalciferol 25 MCG (1000 UT) tablet Take by mouth.     ibandronate  (BONIVA ) 150 MG tablet TAKE (1) TABLET ONCE A MONTH ONLY. TAKE WITH 6 TO 8 OZ OF WATER AND DO NOT LAY DOWN FOR 1 HOUR. 1 tablet 12   levothyroxine  (SYNTHROID ) 50 MCG tablet  Take 1 tablet (50 mcg total) by mouth daily. 90 tablet 3   lisinopril (ZESTRIL) 2.5 MG tablet daily.     loratadine (CLARITIN) 10 MG tablet Take by mouth.     Multiple Vitamin (MULTIVITAMIN) tablet Take 1 tablet by mouth daily.     predniSONE (STERAPRED UNI-PAK 48 TAB) 10 MG (48) TBPK tablet Take by mouth daily. 12 days ds 10 mg as directed 48 tablet 0   Vitamin A 2400 MCG (8000 UT) CAPS Take by mouth.     No current facility-administered medications for this visit.     Physical Exam BP 116/80   Pulse (!) 105   Ht 5\' 2"  (1.575 m)   Wt 178 lb (80.7 kg)   BMI 32.56 kg/m   Gen. appearance: The patient is well-developed and well-nourished grooming and hygiene are normal The patient is oriented to person place and time The patient's mood is normal and the affect is normal   Gait assessment: The patient stands with  normal gait and station  Lumbar spine Mild tenderness  to palpation is noted in the lower L4-5 and 5 S1 segment   Muscle tone  normal on the right and left sides of the spine  Lower extremities  Normal range of motion hip    Strength right lower extremity L4, L5 Strength left lower extremity L4 L5  Neurologic right lower extremity examination  Reflexes were 2+   Sensation was normal and equal     Straight leg raise testing normal The vascular examination normal except for some varicosities

## 2023-07-19 NOTE — Progress Notes (Signed)
  Intake history:  BP 116/80   Pulse (!) 105   Ht 5\' 2"  (1.575 m)   Wt 178 lb (80.7 kg)   BMI 32.56 kg/m  Body mass index is 32.56 kg/m.    WHAT ARE WE SEEING YOU FOR TODAY?   back - right lower back  Radiation?: yes - .   Loss of bowel/urine control?  no  How Elodie Panameno has this bothered you? (DOI?DOS?WS?)  several week(s) ago  Anticoag.  No  Diabetes No  Heart disease No  Hypertension No  SMOKING HX no  Kidney disease Yes  Any ALLERGIES ______________________________________________   Treatment:  Have you taken:  Tylenol Yes  Advil No  Had PT No  Had injection No  Other  _________________________

## 2023-07-23 ENCOUNTER — Other Ambulatory Visit: Payer: Self-pay | Admitting: Adult Health

## 2023-08-09 ENCOUNTER — Ambulatory Visit: Admitting: Orthopedic Surgery

## 2023-08-09 ENCOUNTER — Encounter: Payer: Self-pay | Admitting: Orthopedic Surgery

## 2023-08-09 DIAGNOSIS — M5441 Lumbago with sciatica, right side: Secondary | ICD-10-CM | POA: Diagnosis not present

## 2023-08-09 NOTE — Progress Notes (Signed)
   There were no vitals taken for this visit.  There is no height or weight on file to calculate BMI.  Chief Complaint  Patient presents with   Back Pain    No diagnosis found.  DOI/DOS/ Date:    Improved     Took steroid taper better but still has right buttock pain

## 2023-08-09 NOTE — Progress Notes (Signed)
 Chief Complaint  Patient presents with   Back Pain    78 year old female back pain with sciatica treated with steroid Dosepak which relieved most of her leg pain she still having some buttock pain and back pain with occasional radiation into the hamstring.  She has been able to do some of her tasks but has some trouble still with some activities of daily living and still cannot do her yard work  I presented her the option of no further treatment versus further intervention such as physical therapy  She opted to have physical therapy  We will order physical therapy follow-up in 6 weeks if no improvement then we will have her see Dr. Sulema Endo or physician in [place].

## 2023-08-09 NOTE — Patient Instructions (Signed)
 Physical therapy has been ordered for you at [facility]. They should call you to schedule, [phone] is the phone number to call, if you want to call to schedule.

## 2023-09-07 ENCOUNTER — Ambulatory Visit (HOSPITAL_COMMUNITY)

## 2023-09-20 ENCOUNTER — Ambulatory Visit: Admitting: Orthopedic Surgery

## 2024-01-06 DIAGNOSIS — Z23 Encounter for immunization: Secondary | ICD-10-CM | POA: Diagnosis not present

## 2024-01-11 DIAGNOSIS — E559 Vitamin D deficiency, unspecified: Secondary | ICD-10-CM | POA: Diagnosis not present

## 2024-01-11 DIAGNOSIS — R809 Proteinuria, unspecified: Secondary | ICD-10-CM | POA: Diagnosis not present

## 2024-01-11 DIAGNOSIS — D631 Anemia in chronic kidney disease: Secondary | ICD-10-CM | POA: Diagnosis not present

## 2024-01-11 DIAGNOSIS — E211 Secondary hyperparathyroidism, not elsewhere classified: Secondary | ICD-10-CM | POA: Diagnosis not present

## 2024-01-11 DIAGNOSIS — E119 Type 2 diabetes mellitus without complications: Secondary | ICD-10-CM | POA: Diagnosis not present

## 2024-01-11 DIAGNOSIS — N189 Chronic kidney disease, unspecified: Secondary | ICD-10-CM | POA: Diagnosis not present

## 2024-01-18 DIAGNOSIS — I129 Hypertensive chronic kidney disease with stage 1 through stage 4 chronic kidney disease, or unspecified chronic kidney disease: Secondary | ICD-10-CM | POA: Diagnosis not present

## 2024-01-18 DIAGNOSIS — R809 Proteinuria, unspecified: Secondary | ICD-10-CM | POA: Diagnosis not present

## 2024-01-18 DIAGNOSIS — N1831 Chronic kidney disease, stage 3a: Secondary | ICD-10-CM | POA: Diagnosis not present

## 2024-04-13 ENCOUNTER — Other Ambulatory Visit (HOSPITAL_COMMUNITY): Payer: Self-pay | Admitting: Adult Health

## 2024-04-13 DIAGNOSIS — Z1231 Encounter for screening mammogram for malignant neoplasm of breast: Secondary | ICD-10-CM

## 2024-05-15 ENCOUNTER — Ambulatory Visit (HOSPITAL_COMMUNITY)
